# Patient Record
Sex: Female | Born: 1945
Health system: Southern US, Community
[De-identification: ages and names within clinical notes are randomized; demographics above are authoritative.]

## PROBLEM LIST (undated history)

## (undated) DIAGNOSIS — E7439 Other disorders of intestinal carbohydrate absorption: Secondary | ICD-10-CM

## (undated) DIAGNOSIS — I519 Heart disease, unspecified: Secondary | ICD-10-CM

## (undated) DIAGNOSIS — M1990 Unspecified osteoarthritis, unspecified site: Secondary | ICD-10-CM

## (undated) DIAGNOSIS — E039 Hypothyroidism, unspecified: Secondary | ICD-10-CM

## (undated) DIAGNOSIS — F419 Anxiety disorder, unspecified: Secondary | ICD-10-CM

## (undated) DIAGNOSIS — G629 Polyneuropathy, unspecified: Secondary | ICD-10-CM

## (undated) DIAGNOSIS — I2510 Atherosclerotic heart disease of native coronary artery without angina pectoris: Secondary | ICD-10-CM

## (undated) DIAGNOSIS — I219 Acute myocardial infarction, unspecified: Secondary | ICD-10-CM

## (undated) DIAGNOSIS — I10 Essential (primary) hypertension: Secondary | ICD-10-CM

## (undated) HISTORY — DX: Hypothyroidism, unspecified: E03.9

## (undated) HISTORY — DX: Essential (primary) hypertension: I10

## (undated) HISTORY — DX: Polyneuropathy, unspecified: G62.9

## (undated) HISTORY — DX: Atherosclerotic heart disease of native coronary artery without angina pectoris: I25.10

## (undated) HISTORY — DX: Heart disease, unspecified: I51.9

## (undated) HISTORY — DX: Other disorders of intestinal carbohydrate absorption: E74.39

## (undated) HISTORY — DX: Anxiety disorder, unspecified: F41.9

---

## 1977-03-02 HISTORY — PX: TOTAL ABDOMINAL HYSTERECTOMY W/ BILATERAL SALPINGOOPHORECTOMY: SHX83

## 1977-03-02 HISTORY — PX: CHOLECYSTECTOMY: SHX55

## 1984-03-02 HISTORY — PX: CARDIAC SURGERY: SHX584

## 1997-03-02 HISTORY — PX: CORONARY ARTERY BYPASS GRAFT: SHX141

## 1997-06-01 ENCOUNTER — Encounter (HOSPITAL_COMMUNITY): Admission: RE | Admit: 1997-06-01 | Discharge: 1997-08-30 | Payer: Self-pay | Admitting: Cardiology

## 2000-12-28 ENCOUNTER — Encounter (INDEPENDENT_AMBULATORY_CARE_PROVIDER_SITE_OTHER): Payer: Self-pay | Admitting: Specialist

## 2000-12-28 ENCOUNTER — Ambulatory Visit (HOSPITAL_COMMUNITY): Admission: RE | Admit: 2000-12-28 | Discharge: 2000-12-28 | Payer: Self-pay | Admitting: Gastroenterology

## 2008-03-02 HISTORY — PX: OTHER SURGICAL HISTORY: SHX169

## 2009-05-23 ENCOUNTER — Inpatient Hospital Stay (HOSPITAL_COMMUNITY): Admission: EM | Admit: 2009-05-23 | Discharge: 2009-05-25 | Payer: Self-pay | Admitting: Emergency Medicine

## 2009-08-18 ENCOUNTER — Emergency Department (HOSPITAL_BASED_OUTPATIENT_CLINIC_OR_DEPARTMENT_OTHER): Admission: EM | Admit: 2009-08-18 | Discharge: 2009-08-18 | Payer: Self-pay | Admitting: Emergency Medicine

## 2009-08-18 ENCOUNTER — Ambulatory Visit: Payer: Self-pay | Admitting: Radiology

## 2010-05-26 LAB — BASIC METABOLIC PANEL
CO2: 19 mEq/L (ref 19–32)
Calcium: 9 mg/dL (ref 8.4–10.5)
Creatinine, Ser: 0.63 mg/dL (ref 0.4–1.2)
GFR calc Af Amer: >60 mL/min (ref >60)
GFR calc non Af Amer: >60 mL/min (ref >60)
Glucose, Bld: 79 mg/dL (ref 70–99)
Potassium: 4.5 mEq/L (ref 3.5–5.1)

## 2010-05-26 LAB — CBC
HCT: 35.5 % — ABNORMAL LOW (ref 36.0–46.0)
HCT: 38.5 % (ref 36.0–46.0)
HCT: 40.6 % (ref 36.0–46.0)
Hemoglobin: 13 g/dL (ref 12.0–15.0)
Hemoglobin: 13.7 g/dL (ref 12.0–15.0)
MCHC: 34 g/dL (ref 30.0–36.0)
MCV: 91.3 fL (ref 78.0–100.0)
MCV: 91.4 fL (ref 78.0–100.0)
RBC: 4.49 MIL/uL (ref 3.87–5.11)
RDW: 13.9 % (ref 11.5–15.5)
RDW: 14 % (ref 11.5–15.5)
RDW: 14.1 % (ref 11.5–15.5)
WBC: 8.7 10*3/uL (ref 4.0–10.5)

## 2010-05-26 LAB — CK TOTAL AND CKMB (NOT AT ARMC)
Relative Index: INVALID (ref 0.0–2.5)
Total CK: 48 U/L (ref 7–177)

## 2010-05-26 LAB — COMPREHENSIVE METABOLIC PANEL
AST: 23 U/L (ref 0–37)
Alkaline Phosphatase: 58 U/L (ref 39–117)
BUN: 13 mg/dL (ref 6–23)
CO2: 25 mEq/L (ref 19–32)
Calcium: 9.6 mg/dL (ref 8.4–10.5)
Creatinine, Ser: 0.56 mg/dL (ref 0.4–1.2)
Sodium: 138 mEq/L (ref 135–145)
Total Bilirubin: 0.7 mg/dL (ref 0.3–1.2)

## 2010-05-26 LAB — CARDIAC PANEL(CRET KIN+CKTOT+MB+TROPI)
CK, MB: 1.2 ng/mL (ref 0.3–4.0)
Relative Index: INVALID (ref 0.0–2.5)
Troponin I: 0.1 ng/mL — ABNORMAL HIGH (ref 0.00–0.06)
Troponin I: 0.1 ng/mL — ABNORMAL HIGH (ref 0.00–0.06)

## 2010-05-26 LAB — POCT CARDIAC MARKERS
CKMB, poc: <1 ng/mL — ABNORMAL LOW (ref 1.0–8.0)
Myoglobin, poc: 49.8 ng/mL (ref 12–200)
Troponin i, poc: <0.05 ng/mL (ref 0.00–0.09)

## 2010-05-26 LAB — CK: Total CK: 56 U/L (ref 7–177)

## 2010-05-26 LAB — TROPONIN I: Troponin I: 0.09 ng/mL — ABNORMAL HIGH (ref 0.00–0.06)

## 2010-05-26 LAB — DIFFERENTIAL
Monocytes Absolute: 0.8 10*3/uL (ref 0.1–1.0)
Neutrophils Relative %: 53 % (ref 43–77)

## 2010-05-26 LAB — BRAIN NATRIURETIC PEPTIDE: Pro B Natriuretic peptide (BNP): 296 pg/mL — ABNORMAL HIGH (ref 0.0–100.0)

## 2010-05-26 LAB — PROTIME-INR: INR: 0.95 (ref 0.00–1.49)

## 2010-05-26 LAB — TSH: TSH: 3.504 u[IU]/mL (ref 0.350–4.500)

## 2010-05-26 LAB — HEPARIN LEVEL (UNFRACTIONATED): Heparin Unfractionated: 0.24 IU/mL — ABNORMAL LOW (ref 0.30–0.70)

## 2010-05-26 LAB — APTT: aPTT: 35 seconds (ref 24–37)

## 2011-03-19 DIAGNOSIS — D239 Other benign neoplasm of skin, unspecified: Secondary | ICD-10-CM | POA: Diagnosis not present

## 2011-03-19 DIAGNOSIS — L570 Actinic keratosis: Secondary | ICD-10-CM | POA: Diagnosis not present

## 2011-03-19 DIAGNOSIS — L821 Other seborrheic keratosis: Secondary | ICD-10-CM | POA: Diagnosis not present

## 2011-04-14 DIAGNOSIS — I10 Essential (primary) hypertension: Secondary | ICD-10-CM | POA: Diagnosis not present

## 2011-04-14 DIAGNOSIS — G609 Hereditary and idiopathic neuropathy, unspecified: Secondary | ICD-10-CM | POA: Diagnosis not present

## 2011-04-14 DIAGNOSIS — R7301 Impaired fasting glucose: Secondary | ICD-10-CM | POA: Diagnosis not present

## 2011-04-14 DIAGNOSIS — E039 Hypothyroidism, unspecified: Secondary | ICD-10-CM | POA: Diagnosis not present

## 2011-10-07 DIAGNOSIS — E785 Hyperlipidemia, unspecified: Secondary | ICD-10-CM | POA: Diagnosis not present

## 2011-10-07 DIAGNOSIS — I10 Essential (primary) hypertension: Secondary | ICD-10-CM | POA: Diagnosis not present

## 2011-10-07 DIAGNOSIS — I2510 Atherosclerotic heart disease of native coronary artery without angina pectoris: Secondary | ICD-10-CM | POA: Diagnosis not present

## 2011-10-07 DIAGNOSIS — E039 Hypothyroidism, unspecified: Secondary | ICD-10-CM | POA: Diagnosis not present

## 2011-10-07 DIAGNOSIS — R7301 Impaired fasting glucose: Secondary | ICD-10-CM | POA: Diagnosis not present

## 2011-10-14 DIAGNOSIS — I10 Essential (primary) hypertension: Secondary | ICD-10-CM | POA: Diagnosis not present

## 2011-10-14 DIAGNOSIS — I2510 Atherosclerotic heart disease of native coronary artery without angina pectoris: Secondary | ICD-10-CM | POA: Diagnosis not present

## 2011-10-14 DIAGNOSIS — Z0000 Encounter for general adult medical examination without abnormal findings: Secondary | ICD-10-CM | POA: Diagnosis not present

## 2011-10-14 DIAGNOSIS — G609 Hereditary and idiopathic neuropathy, unspecified: Secondary | ICD-10-CM | POA: Diagnosis not present

## 2011-10-15 DIAGNOSIS — Z1212 Encounter for screening for malignant neoplasm of rectum: Secondary | ICD-10-CM | POA: Diagnosis not present

## 2011-11-25 DIAGNOSIS — Z23 Encounter for immunization: Secondary | ICD-10-CM | POA: Diagnosis not present

## 2012-01-14 DIAGNOSIS — Z9861 Coronary angioplasty status: Secondary | ICD-10-CM | POA: Diagnosis not present

## 2012-01-14 DIAGNOSIS — I447 Left bundle-branch block, unspecified: Secondary | ICD-10-CM | POA: Diagnosis not present

## 2012-01-14 DIAGNOSIS — I119 Hypertensive heart disease without heart failure: Secondary | ICD-10-CM | POA: Diagnosis not present

## 2012-01-14 DIAGNOSIS — E669 Obesity, unspecified: Secondary | ICD-10-CM | POA: Diagnosis not present

## 2012-01-14 DIAGNOSIS — I2510 Atherosclerotic heart disease of native coronary artery without angina pectoris: Secondary | ICD-10-CM | POA: Diagnosis not present

## 2012-01-14 DIAGNOSIS — E785 Hyperlipidemia, unspecified: Secondary | ICD-10-CM | POA: Diagnosis not present

## 2012-02-12 DIAGNOSIS — Z1231 Encounter for screening mammogram for malignant neoplasm of breast: Secondary | ICD-10-CM | POA: Diagnosis not present

## 2012-02-16 DIAGNOSIS — I2510 Atherosclerotic heart disease of native coronary artery without angina pectoris: Secondary | ICD-10-CM | POA: Diagnosis not present

## 2012-02-16 DIAGNOSIS — G609 Hereditary and idiopathic neuropathy, unspecified: Secondary | ICD-10-CM | POA: Diagnosis not present

## 2012-02-16 DIAGNOSIS — Z1331 Encounter for screening for depression: Secondary | ICD-10-CM | POA: Diagnosis not present

## 2012-02-16 DIAGNOSIS — R7301 Impaired fasting glucose: Secondary | ICD-10-CM | POA: Diagnosis not present

## 2012-06-28 DIAGNOSIS — E039 Hypothyroidism, unspecified: Secondary | ICD-10-CM | POA: Diagnosis not present

## 2012-06-28 DIAGNOSIS — I10 Essential (primary) hypertension: Secondary | ICD-10-CM | POA: Diagnosis not present

## 2012-06-28 DIAGNOSIS — R7301 Impaired fasting glucose: Secondary | ICD-10-CM | POA: Diagnosis not present

## 2012-06-28 DIAGNOSIS — F411 Generalized anxiety disorder: Secondary | ICD-10-CM | POA: Diagnosis not present

## 2012-06-28 DIAGNOSIS — G609 Hereditary and idiopathic neuropathy, unspecified: Secondary | ICD-10-CM | POA: Diagnosis not present

## 2012-09-08 DIAGNOSIS — H40019 Open angle with borderline findings, low risk, unspecified eye: Secondary | ICD-10-CM | POA: Diagnosis not present

## 2012-09-08 DIAGNOSIS — H40029 Open angle with borderline findings, high risk, unspecified eye: Secondary | ICD-10-CM | POA: Diagnosis not present

## 2012-09-08 DIAGNOSIS — H524 Presbyopia: Secondary | ICD-10-CM | POA: Diagnosis not present

## 2012-09-08 DIAGNOSIS — H04129 Dry eye syndrome of unspecified lacrimal gland: Secondary | ICD-10-CM | POA: Diagnosis not present

## 2012-12-24 DIAGNOSIS — Z23 Encounter for immunization: Secondary | ICD-10-CM | POA: Diagnosis not present

## 2013-01-20 DIAGNOSIS — I10 Essential (primary) hypertension: Secondary | ICD-10-CM | POA: Diagnosis not present

## 2013-01-20 DIAGNOSIS — E785 Hyperlipidemia, unspecified: Secondary | ICD-10-CM | POA: Diagnosis not present

## 2013-01-20 DIAGNOSIS — R7301 Impaired fasting glucose: Secondary | ICD-10-CM | POA: Diagnosis not present

## 2013-01-20 DIAGNOSIS — R809 Proteinuria, unspecified: Secondary | ICD-10-CM | POA: Diagnosis not present

## 2013-01-20 DIAGNOSIS — I2510 Atherosclerotic heart disease of native coronary artery without angina pectoris: Secondary | ICD-10-CM | POA: Diagnosis not present

## 2013-01-20 DIAGNOSIS — E039 Hypothyroidism, unspecified: Secondary | ICD-10-CM | POA: Diagnosis not present

## 2013-02-03 DIAGNOSIS — F411 Generalized anxiety disorder: Secondary | ICD-10-CM | POA: Diagnosis not present

## 2013-02-03 DIAGNOSIS — G609 Hereditary and idiopathic neuropathy, unspecified: Secondary | ICD-10-CM | POA: Diagnosis not present

## 2013-02-03 DIAGNOSIS — N760 Acute vaginitis: Secondary | ICD-10-CM | POA: Diagnosis not present

## 2013-02-03 DIAGNOSIS — R7301 Impaired fasting glucose: Secondary | ICD-10-CM | POA: Diagnosis not present

## 2013-02-03 DIAGNOSIS — I2510 Atherosclerotic heart disease of native coronary artery without angina pectoris: Secondary | ICD-10-CM | POA: Diagnosis not present

## 2013-02-03 DIAGNOSIS — Z0000 Encounter for general adult medical examination without abnormal findings: Secondary | ICD-10-CM | POA: Diagnosis not present

## 2013-02-03 DIAGNOSIS — R82998 Other abnormal findings in urine: Secondary | ICD-10-CM | POA: Diagnosis not present

## 2013-02-03 DIAGNOSIS — E785 Hyperlipidemia, unspecified: Secondary | ICD-10-CM | POA: Diagnosis not present

## 2013-02-07 DIAGNOSIS — Z1212 Encounter for screening for malignant neoplasm of rectum: Secondary | ICD-10-CM | POA: Diagnosis not present

## 2013-02-09 DIAGNOSIS — I119 Hypertensive heart disease without heart failure: Secondary | ICD-10-CM | POA: Diagnosis not present

## 2013-02-09 DIAGNOSIS — E785 Hyperlipidemia, unspecified: Secondary | ICD-10-CM | POA: Diagnosis not present

## 2013-02-09 DIAGNOSIS — I2510 Atherosclerotic heart disease of native coronary artery without angina pectoris: Secondary | ICD-10-CM | POA: Diagnosis not present

## 2013-02-09 DIAGNOSIS — Z9861 Coronary angioplasty status: Secondary | ICD-10-CM | POA: Diagnosis not present

## 2013-02-09 DIAGNOSIS — I447 Left bundle-branch block, unspecified: Secondary | ICD-10-CM | POA: Diagnosis not present

## 2013-02-09 DIAGNOSIS — E669 Obesity, unspecified: Secondary | ICD-10-CM | POA: Diagnosis not present

## 2013-02-16 DIAGNOSIS — Z1231 Encounter for screening mammogram for malignant neoplasm of breast: Secondary | ICD-10-CM | POA: Diagnosis not present

## 2013-05-18 DIAGNOSIS — E785 Hyperlipidemia, unspecified: Secondary | ICD-10-CM | POA: Diagnosis not present

## 2013-05-18 DIAGNOSIS — I2510 Atherosclerotic heart disease of native coronary artery without angina pectoris: Secondary | ICD-10-CM | POA: Diagnosis not present

## 2013-05-18 DIAGNOSIS — I447 Left bundle-branch block, unspecified: Secondary | ICD-10-CM | POA: Diagnosis not present

## 2013-05-18 DIAGNOSIS — Z9861 Coronary angioplasty status: Secondary | ICD-10-CM | POA: Diagnosis not present

## 2013-05-18 DIAGNOSIS — E669 Obesity, unspecified: Secondary | ICD-10-CM | POA: Diagnosis not present

## 2013-05-18 DIAGNOSIS — I119 Hypertensive heart disease without heart failure: Secondary | ICD-10-CM | POA: Diagnosis not present

## 2013-06-14 DIAGNOSIS — G609 Hereditary and idiopathic neuropathy, unspecified: Secondary | ICD-10-CM | POA: Diagnosis not present

## 2013-06-14 DIAGNOSIS — Z6838 Body mass index (BMI) 38.0-38.9, adult: Secondary | ICD-10-CM | POA: Diagnosis not present

## 2013-06-14 DIAGNOSIS — R7301 Impaired fasting glucose: Secondary | ICD-10-CM | POA: Diagnosis not present

## 2013-06-14 DIAGNOSIS — I10 Essential (primary) hypertension: Secondary | ICD-10-CM | POA: Diagnosis not present

## 2013-06-14 DIAGNOSIS — E039 Hypothyroidism, unspecified: Secondary | ICD-10-CM | POA: Diagnosis not present

## 2013-10-19 DIAGNOSIS — G609 Hereditary and idiopathic neuropathy, unspecified: Secondary | ICD-10-CM | POA: Diagnosis not present

## 2013-10-19 DIAGNOSIS — R61 Generalized hyperhidrosis: Secondary | ICD-10-CM | POA: Diagnosis not present

## 2013-10-19 DIAGNOSIS — R7301 Impaired fasting glucose: Secondary | ICD-10-CM | POA: Diagnosis not present

## 2013-10-19 DIAGNOSIS — Z6841 Body Mass Index (BMI) 40.0 and over, adult: Secondary | ICD-10-CM | POA: Diagnosis not present

## 2013-10-19 DIAGNOSIS — E039 Hypothyroidism, unspecified: Secondary | ICD-10-CM | POA: Diagnosis not present

## 2013-10-19 DIAGNOSIS — I10 Essential (primary) hypertension: Secondary | ICD-10-CM | POA: Diagnosis not present

## 2013-10-24 DIAGNOSIS — H18519 Endothelial corneal dystrophy, unspecified eye: Secondary | ICD-10-CM | POA: Diagnosis not present

## 2013-10-24 DIAGNOSIS — H01139 Eczematous dermatitis of unspecified eye, unspecified eyelid: Secondary | ICD-10-CM | POA: Diagnosis not present

## 2013-12-05 DIAGNOSIS — H40013 Open angle with borderline findings, low risk, bilateral: Secondary | ICD-10-CM | POA: Diagnosis not present

## 2013-12-07 DIAGNOSIS — Z955 Presence of coronary angioplasty implant and graft: Secondary | ICD-10-CM | POA: Diagnosis not present

## 2013-12-07 DIAGNOSIS — Z23 Encounter for immunization: Secondary | ICD-10-CM | POA: Diagnosis not present

## 2013-12-07 DIAGNOSIS — E785 Hyperlipidemia, unspecified: Secondary | ICD-10-CM | POA: Diagnosis not present

## 2013-12-07 DIAGNOSIS — E668 Other obesity: Secondary | ICD-10-CM | POA: Diagnosis not present

## 2013-12-07 DIAGNOSIS — I119 Hypertensive heart disease without heart failure: Secondary | ICD-10-CM | POA: Diagnosis not present

## 2013-12-07 DIAGNOSIS — I2510 Atherosclerotic heart disease of native coronary artery without angina pectoris: Secondary | ICD-10-CM | POA: Diagnosis not present

## 2013-12-07 DIAGNOSIS — I447 Left bundle-branch block, unspecified: Secondary | ICD-10-CM | POA: Diagnosis not present

## 2013-12-26 DIAGNOSIS — H2513 Age-related nuclear cataract, bilateral: Secondary | ICD-10-CM | POA: Diagnosis not present

## 2013-12-26 DIAGNOSIS — H40013 Open angle with borderline findings, low risk, bilateral: Secondary | ICD-10-CM | POA: Diagnosis not present

## 2013-12-26 DIAGNOSIS — H524 Presbyopia: Secondary | ICD-10-CM | POA: Diagnosis not present

## 2013-12-26 DIAGNOSIS — H04123 Dry eye syndrome of bilateral lacrimal glands: Secondary | ICD-10-CM | POA: Diagnosis not present

## 2014-01-31 DIAGNOSIS — R8299 Other abnormal findings in urine: Secondary | ICD-10-CM | POA: Diagnosis not present

## 2014-01-31 DIAGNOSIS — R7301 Impaired fasting glucose: Secondary | ICD-10-CM | POA: Diagnosis not present

## 2014-01-31 DIAGNOSIS — E039 Hypothyroidism, unspecified: Secondary | ICD-10-CM | POA: Diagnosis not present

## 2014-01-31 DIAGNOSIS — Z0000 Encounter for general adult medical examination without abnormal findings: Secondary | ICD-10-CM | POA: Diagnosis not present

## 2014-01-31 DIAGNOSIS — E785 Hyperlipidemia, unspecified: Secondary | ICD-10-CM | POA: Diagnosis not present

## 2014-01-31 DIAGNOSIS — I10 Essential (primary) hypertension: Secondary | ICD-10-CM | POA: Diagnosis not present

## 2014-01-31 DIAGNOSIS — I2510 Atherosclerotic heart disease of native coronary artery without angina pectoris: Secondary | ICD-10-CM | POA: Diagnosis not present

## 2014-02-06 DIAGNOSIS — I2510 Atherosclerotic heart disease of native coronary artery without angina pectoris: Secondary | ICD-10-CM | POA: Diagnosis not present

## 2014-02-06 DIAGNOSIS — F419 Anxiety disorder, unspecified: Secondary | ICD-10-CM | POA: Diagnosis not present

## 2014-02-06 DIAGNOSIS — Z008 Encounter for other general examination: Secondary | ICD-10-CM | POA: Diagnosis not present

## 2014-02-06 DIAGNOSIS — Z1389 Encounter for screening for other disorder: Secondary | ICD-10-CM | POA: Diagnosis not present

## 2014-02-06 DIAGNOSIS — R7301 Impaired fasting glucose: Secondary | ICD-10-CM | POA: Diagnosis not present

## 2014-02-06 DIAGNOSIS — I10 Essential (primary) hypertension: Secondary | ICD-10-CM | POA: Diagnosis not present

## 2014-02-06 DIAGNOSIS — E039 Hypothyroidism, unspecified: Secondary | ICD-10-CM | POA: Diagnosis not present

## 2014-02-06 DIAGNOSIS — Z23 Encounter for immunization: Secondary | ICD-10-CM | POA: Diagnosis not present

## 2014-02-06 DIAGNOSIS — G609 Hereditary and idiopathic neuropathy, unspecified: Secondary | ICD-10-CM | POA: Diagnosis not present

## 2014-02-06 DIAGNOSIS — E785 Hyperlipidemia, unspecified: Secondary | ICD-10-CM | POA: Diagnosis not present

## 2014-02-07 DIAGNOSIS — Z1212 Encounter for screening for malignant neoplasm of rectum: Secondary | ICD-10-CM | POA: Diagnosis not present

## 2014-02-21 ENCOUNTER — Encounter: Payer: Self-pay | Admitting: Neurology

## 2014-03-08 DIAGNOSIS — Z1231 Encounter for screening mammogram for malignant neoplasm of breast: Secondary | ICD-10-CM | POA: Diagnosis not present

## 2014-03-11 ENCOUNTER — Telehealth: Payer: Self-pay | Admitting: Neurology

## 2014-03-11 NOTE — Telephone Encounter (Signed)
Patient was previously seen by Dr. Erling Cruz. Please pull old notes.

## 2014-03-12 ENCOUNTER — Encounter: Payer: Self-pay | Admitting: Neurology

## 2014-03-12 ENCOUNTER — Ambulatory Visit (INDEPENDENT_AMBULATORY_CARE_PROVIDER_SITE_OTHER): Payer: Medicare Other | Admitting: Neurology

## 2014-03-12 VITALS — BP 124/71 | HR 61 | Temp 98.0°F | Ht 63.0 in | Wt 231.0 lb

## 2014-03-12 DIAGNOSIS — M545 Low back pain, unspecified: Secondary | ICD-10-CM

## 2014-03-12 DIAGNOSIS — G609 Hereditary and idiopathic neuropathy, unspecified: Secondary | ICD-10-CM

## 2014-03-12 NOTE — Patient Instructions (Addendum)
You have a condition called peripheral neuropathy, i. e. nerve damage. There is no specific treatment for most neuropathies. The most common cause for neuropathy is diabetes in this country, in which case, tight glucose control is key. Other causes include thyroid disease, and some vitamin deficiencies. Certain medications such as chemotherapy agents and other chemicals or toxins including alcohol can cause neuropathy. There are some genetic conditions or hereditary neuropathies. Typically patients will report a family history of neuropathy in those conditions. There are cases associated with cancers and autoimmune conditions. Most neuropathies are progressive unless a root cause can be found and treated. For most neuropathies there is no actual cure or reversing of symptoms. Painful neuropathy can be difficult to treat symptomatically, but there are some medications available to ease the symptoms. Electrophysiologic testing with nerve conduction velocity studies and EMG (muscle testing) do not always pick up neuropathies that affect the smallest fibers. Other common tests include different type of blood work, and rarely, spinal fluid testing, and sometimes we resort to asking for a nerve and muscle biopsy.  I think, using gabapentin is the best symptomatic treatment. I can see you back as needed at this time.

## 2014-03-12 NOTE — Progress Notes (Signed)
Subjective:    Patient ID: Kristin Allen is a 69 y.o. female.  HPI     Star Age, MD, PhD [facility] [address] [place],  [address]  Dear Dr. Dagmar Hait,   I saw your patient, Kristin Allen, upon your kind request in my neurologic clinic today for initial consultation of her peripheral neuropathy. The patient is unaccompanied today. As you know, Kristin Allen is a 69 year old right-handed woman with an underlying medical history of coronary artery disease, status post CABG and single stent placement, hypertension, hypothyroidism, hyperlipidemia, anxiety disorder, glucose intolerance and obesity, who has had a long-standing history of peripheral neuropathy for which she was followed by Dr. Erling Cruz in the past. She reports progression of foot pain. She has thankfully not fallen. She tries to exercise at least twice a week in the form of walking. She has had workup for neuropathy in the past, including a EMG/NCV test, she says. Symptomatic treatment includes gabapentin, total of 1800 mg daily. She tried Lyrica, which she does not recall if it helped, and is on as needed hydrocodone for back pain and has has not tolerated Cymbalta in the past, due to profuse sweating. She has also tried topical agents, which did not help. She saw a dermatologist as well.   She has looked into a supplement, called Nerve Renew, which contains B vitamins and some other supplements that I am not familiar with. She has not started it yet.  She has occasional fingertips. She has not noted any muscle wasting or actual weakness. She does not require walking aid. She has a walking cane available from her deceased mother-in-law.  Her Past Medical History Is Significant For: Past Medical History  Diagnosis Date  . CAD (coronary artery disease)     positive test in 2011 with one stent placed in Dr Wynonia Lawman and colleagues  . HTN (hypertension)   . Hypothyroidism   . Glucose  intolerance (malabsorption)   . Peripheral neuropathy     followed by Dr Erling Cruz every 5 years  . Anxiety   . Heart disease     Her Past Surgical History Is Significant For: Past Surgical History  Procedure Laterality Date  . Coronary artery bypass graft  1999  . Cholecystectomy  1979  . Total abdominal hysterectomy w/ bilateral salpingoophorectomy  1979  . Stint  2010  . Cardiac surgery  1986    by pass    Her Family History Is Significant For: Family History  Problem Relation Age of Onset  . Heart disease Father   . CAD Father   . CVA Father   . Hypertension Father   . Diabetes Mellitus I Sister   . Anxiety disorder Sister   . Aneurysm Sister   . Heart attack Father     Her Social History Is Significant For: History   Social History  . Marital Status: Married    Spouse Name: Juanda Crumble    Number of Children: 3  . Years of Education: 12   Occupational History  .      retired 2009   Social History Main Topics  . Smoking status: Former Research scientist (life sciences)  . Smokeless tobacco: Never Used  . Alcohol Use: 0.0 oz/week    0 Not specified per week     Comment: wine  . Drug Use: No  . Sexual Activity: None   Other Topics Concern  . None   Social History Narrative   Consumes 2 cups of caffeine  daily    Her Allergies Are:  Allergies  Allergen Reactions  . Demerol [Meperidine]     hallucinations  :   Her Current Medications Are:  Outpatient Encounter Prescriptions as of 03/12/2014  Medication Sig  . Acetaminophen (TYLENOL EXTRA STRENGTH PO) Take by mouth. 500-25mg , take one tablet qhs prn  . ALPRAZolam (XANAX) 0.5 MG tablet Take 0.5 mg by mouth 3 (three) times daily.  Marland Kitchen aspirin EC 81 MG tablet Take 81 mg by mouth daily.  Marland Kitchen atorvastatin (LIPITOR) 40 MG tablet Take 40 mg by mouth daily.  . Calcium Carbonate-Vitamin D (CALTRATE 600+D PO) Take by mouth 2 (two) times daily.  . clopidogrel (PLAVIX) 75 MG tablet Take 75 mg by mouth daily.  Marland Kitchen FLUZONE HIGH-DOSE 0.5 ML SUSY   .  furosemide (LASIX) 40 MG tablet Take 40 mg by mouth. Take 2 tablets every am, 1 tablet every pm  . gabapentin (NEURONTIN) 300 MG capsule Take 300 mg by mouth 5 (five) times daily.  . hydrALAZINE (APRESOLINE) 50 MG tablet Take 50 mg by mouth 2 (two) times daily.  Marland Kitchen HYDROcodone-acetaminophen (NORCO/VICODIN) 5-325 MG per tablet Take 1 tablet by mouth every 6 (six) hours as needed for moderate pain. 1-2 every 6 hours as needed  . levothyroxine (SYNTHROID) 50 MCG tablet Take 50 mcg by mouth daily.  . metoprolol (LOPRESSOR) 50 MG tablet Take 50 mg by mouth 2 (two) times daily. Take 1/2 tablet twice daily  . niacin (NIASPAN) 500 MG CR tablet Take 500 mg by mouth at bedtime.  Marland Kitchen olmesartan (BENICAR) 40 MG tablet Take 40 mg by mouth daily.  :   Review of Systems:  Out of a complete 14 point review of systems, all are reviewed and negative with the exception of these symptoms as listed below:   Review of Systems  Neurological: Positive for numbness.    Objective:  Neurologic Exam  Physical Exam Physical Examination:   Filed Vitals:   03/12/14 0934  BP: 124/71  Pulse: 61  Temp: 98 F (36.7 C)    General Examination: The patient is a very pleasant 69 y.o. female in no acute distress. She appears well-developed and well-nourished and very well groomed.   HEENT: Normocephalic, atraumatic, pupils are equal, round and reactive to light and accommodation. Funduscopic exam is normal with sharp disc margins noted. Extraocular tracking is good without limitation to gaze excursion or nystagmus noted. Normal smooth pursuit is noted. Hearing is grossly intact. Tympanic membranes are clear bilaterally. Face is symmetric with normal facial animation and normal facial sensation. Speech is clear with no dysarthria noted. There is no hypophonia. There is no lip, neck/head, jaw or voice tremor. Neck is supple with full range of passive and active motion. There are no carotid bruits on auscultation. Oropharynx  exam reveals: moderate mouth dryness, good dental hygiene and mild airway crowding, due to redundant soft palate. Mallampati is class II. Tongue protrudes centrally and palate elevates symmetrically.   Chest: Clear to auscultation without wheezing, rhonchi or crackles noted.  Heart: S1+S2+0, regular and normal without murmurs, rubs or gallops noted.   Abdomen: Soft, non-tender and non-distended with normal bowel sounds appreciated on auscultation.  Extremities: There is trace pitting edema in the distal lower extremities bilaterally. Pedal pulses are intact.  Skin: Warm and dry without trophic changes noted. There are no varicose veins.  Musculoskeletal: exam reveals no obvious joint deformities, tenderness or joint swelling or erythema.   Neurologically:  Mental status: The patient is awake, alert  and oriented in all 4 spheres. Her immediate and remote memory, attention, language skills and fund of knowledge are appropriate. There is no evidence of aphasia, agnosia, apraxia or anomia. Speech is clear with normal prosody and enunciation. Thought process is linear. Mood is normal and affect is normal.  Cranial nerves II - XII are as described above under HEENT exam. In addition: shoulder shrug is normal with equal shoulder height noted. Motor exam: Normal bulk, strength and tone is noted. There is no drift, tremor or rebound. Romberg is negative. Reflexes are 2+ throughout. Babinski: Toes are flexor bilaterally. Fine motor skills and coordination: intact with normal finger taps, normal hand movements, normal rapid alternating patting, normal foot taps and normal foot agility.  Cerebellar testing: No dysmetria or intention tremor on finger to nose testing. Heel to shin is unremarkable bilaterally. There is no truncal or gait ataxia.  Sensory exam: intact to light touch, pinprick, vibration, temperature sense in the upper extremities with decrease in sensation to all modalities in the distal LEs up  to the midshin areas.  Gait, station and balance: She stands easily. No veering to one side is noted. No leaning to one side is noted. Posture is age-appropriate and stance is narrow based. Gait shows normal stride length and normal pace. No problems turning are noted. Tandem walk is unremarkable. Intact toe and heel stance is noted.               Assessment and Plan:    In summary, Kristin Allen is a very pleasant 69 y.o.-year old female coronary artery disease, status post CABG and single stent placement, hypertension, hypothyroidism, hyperlipidemia, anxiety disorder, glucose intolerance and obesity, who has had a long-standing history of peripheral neuropathy . Her exam is in keeping with decreased sensation to all modalities in the distal lower extremities up to the mid shin areas. She has preserved reflexes throughout. Her balance and walking are not impaired including her tandem walk. Overall, her physical exam is otherwise reassuring. She has had workup for neuropathy in the past. She is not keen on repeating EMG nerve conduction testing which I offered to her. Symptomatic treatment has been tried with several medications including topical agents with limited success her some side effects with other medications, and she is currently on 1800 mg of gabapentin total dose. She takes it at night and 2 doses. It makes her sleepy and she avoids it during the day. For lower back pain she has been using hydrocodone as needed. At this juncture, I explained to her that symptomatic treatment for neuropathy is geared towards controlling discomfort and pain. She has had reasonable success with the current regimen. I explained to her that this time there is probably not much else we can do since she has had workup for neuropathy including electrophysiological testing which she does not desire to repeat at this time. She is encouraged to follow-up with you as previously scheduled and I can see her back as needed. I  answered all her questions today and she was in agreement.  Thank you very much for allowing me to participate in the care of this nice patient. If I can be of any further assistance to you please do not hesitate to call me at [phone].  Sincerely,   Star Age, MD, PhD

## 2014-03-12 NOTE — Telephone Encounter (Signed)
No notes in [facility], will check with Hilda Blades to see if she can find anything

## 2014-06-12 DIAGNOSIS — I2510 Atherosclerotic heart disease of native coronary artery without angina pectoris: Secondary | ICD-10-CM | POA: Diagnosis not present

## 2014-06-12 DIAGNOSIS — Z955 Presence of coronary angioplasty implant and graft: Secondary | ICD-10-CM | POA: Diagnosis not present

## 2014-06-12 DIAGNOSIS — I447 Left bundle-branch block, unspecified: Secondary | ICD-10-CM | POA: Diagnosis not present

## 2014-06-12 DIAGNOSIS — I119 Hypertensive heart disease without heart failure: Secondary | ICD-10-CM | POA: Diagnosis not present

## 2014-06-12 DIAGNOSIS — E668 Other obesity: Secondary | ICD-10-CM | POA: Diagnosis not present

## 2014-06-12 DIAGNOSIS — E785 Hyperlipidemia, unspecified: Secondary | ICD-10-CM | POA: Diagnosis not present

## 2014-08-09 DIAGNOSIS — I10 Essential (primary) hypertension: Secondary | ICD-10-CM | POA: Diagnosis not present

## 2014-08-09 DIAGNOSIS — E785 Hyperlipidemia, unspecified: Secondary | ICD-10-CM | POA: Diagnosis not present

## 2014-08-09 DIAGNOSIS — I2510 Atherosclerotic heart disease of native coronary artery without angina pectoris: Secondary | ICD-10-CM | POA: Diagnosis not present

## 2014-08-09 DIAGNOSIS — R7301 Impaired fasting glucose: Secondary | ICD-10-CM | POA: Diagnosis not present

## 2014-08-09 DIAGNOSIS — G609 Hereditary and idiopathic neuropathy, unspecified: Secondary | ICD-10-CM | POA: Diagnosis not present

## 2014-09-19 DIAGNOSIS — I708 Atherosclerosis of other arteries: Secondary | ICD-10-CM | POA: Diagnosis not present

## 2014-12-03 DIAGNOSIS — Z23 Encounter for immunization: Secondary | ICD-10-CM | POA: Diagnosis not present

## 2015-02-01 DIAGNOSIS — R8299 Other abnormal findings in urine: Secondary | ICD-10-CM | POA: Diagnosis not present

## 2015-02-01 DIAGNOSIS — I2510 Atherosclerotic heart disease of native coronary artery without angina pectoris: Secondary | ICD-10-CM | POA: Diagnosis not present

## 2015-02-01 DIAGNOSIS — N390 Urinary tract infection, site not specified: Secondary | ICD-10-CM | POA: Diagnosis not present

## 2015-02-01 DIAGNOSIS — R7301 Impaired fasting glucose: Secondary | ICD-10-CM | POA: Diagnosis not present

## 2015-02-01 DIAGNOSIS — E784 Other hyperlipidemia: Secondary | ICD-10-CM | POA: Diagnosis not present

## 2015-02-01 DIAGNOSIS — I10 Essential (primary) hypertension: Secondary | ICD-10-CM | POA: Diagnosis not present

## 2015-02-01 DIAGNOSIS — E038 Other specified hypothyroidism: Secondary | ICD-10-CM | POA: Diagnosis not present

## 2015-02-08 DIAGNOSIS — Z0000 Encounter for general adult medical examination without abnormal findings: Secondary | ICD-10-CM | POA: Diagnosis not present

## 2015-02-08 DIAGNOSIS — Z1389 Encounter for screening for other disorder: Secondary | ICD-10-CM | POA: Diagnosis not present

## 2015-02-08 DIAGNOSIS — E784 Other hyperlipidemia: Secondary | ICD-10-CM | POA: Diagnosis not present

## 2015-02-08 DIAGNOSIS — R7301 Impaired fasting glucose: Secondary | ICD-10-CM | POA: Diagnosis not present

## 2015-02-08 DIAGNOSIS — G609 Hereditary and idiopathic neuropathy, unspecified: Secondary | ICD-10-CM | POA: Diagnosis not present

## 2015-02-08 DIAGNOSIS — I10 Essential (primary) hypertension: Secondary | ICD-10-CM | POA: Diagnosis not present

## 2015-02-08 DIAGNOSIS — E038 Other specified hypothyroidism: Secondary | ICD-10-CM | POA: Diagnosis not present

## 2015-02-08 DIAGNOSIS — I2510 Atherosclerotic heart disease of native coronary artery without angina pectoris: Secondary | ICD-10-CM | POA: Diagnosis not present

## 2015-02-08 DIAGNOSIS — J069 Acute upper respiratory infection, unspecified: Secondary | ICD-10-CM | POA: Diagnosis not present

## 2015-02-08 DIAGNOSIS — Z6838 Body mass index (BMI) 38.0-38.9, adult: Secondary | ICD-10-CM | POA: Diagnosis not present

## 2015-02-08 DIAGNOSIS — I700 Atherosclerosis of aorta: Secondary | ICD-10-CM | POA: Diagnosis not present

## 2015-02-11 DIAGNOSIS — Z1212 Encounter for screening for malignant neoplasm of rectum: Secondary | ICD-10-CM | POA: Diagnosis not present

## 2015-03-12 DIAGNOSIS — Z1231 Encounter for screening mammogram for malignant neoplasm of breast: Secondary | ICD-10-CM | POA: Diagnosis not present

## 2015-03-19 DIAGNOSIS — C4441 Basal cell carcinoma of skin of scalp and neck: Secondary | ICD-10-CM | POA: Diagnosis not present

## 2015-03-19 DIAGNOSIS — L565 Disseminated superficial actinic porokeratosis (DSAP): Secondary | ICD-10-CM | POA: Diagnosis not present

## 2015-03-19 DIAGNOSIS — C4442 Squamous cell carcinoma of skin of scalp and neck: Secondary | ICD-10-CM | POA: Diagnosis not present

## 2015-03-29 DIAGNOSIS — Z85828 Personal history of other malignant neoplasm of skin: Secondary | ICD-10-CM | POA: Diagnosis not present

## 2015-03-29 DIAGNOSIS — C4441 Basal cell carcinoma of skin of scalp and neck: Secondary | ICD-10-CM | POA: Diagnosis not present

## 2015-06-27 DIAGNOSIS — I447 Left bundle-branch block, unspecified: Secondary | ICD-10-CM | POA: Diagnosis not present

## 2015-06-27 DIAGNOSIS — E668 Other obesity: Secondary | ICD-10-CM | POA: Diagnosis not present

## 2015-06-27 DIAGNOSIS — E785 Hyperlipidemia, unspecified: Secondary | ICD-10-CM | POA: Diagnosis not present

## 2015-06-27 DIAGNOSIS — I2510 Atherosclerotic heart disease of native coronary artery without angina pectoris: Secondary | ICD-10-CM | POA: Diagnosis not present

## 2015-06-27 DIAGNOSIS — E039 Hypothyroidism, unspecified: Secondary | ICD-10-CM | POA: Diagnosis not present

## 2015-06-27 DIAGNOSIS — I119 Hypertensive heart disease without heart failure: Secondary | ICD-10-CM | POA: Diagnosis not present

## 2015-06-27 DIAGNOSIS — Z955 Presence of coronary angioplasty implant and graft: Secondary | ICD-10-CM | POA: Diagnosis not present

## 2015-07-03 DIAGNOSIS — Z5181 Encounter for therapeutic drug level monitoring: Secondary | ICD-10-CM | POA: Diagnosis not present

## 2015-07-04 DIAGNOSIS — D122 Benign neoplasm of ascending colon: Secondary | ICD-10-CM | POA: Diagnosis not present

## 2015-07-04 DIAGNOSIS — K635 Polyp of colon: Secondary | ICD-10-CM | POA: Diagnosis not present

## 2015-07-04 DIAGNOSIS — Z86010 Personal history of colonic polyps: Secondary | ICD-10-CM | POA: Diagnosis not present

## 2015-07-04 DIAGNOSIS — D124 Benign neoplasm of descending colon: Secondary | ICD-10-CM | POA: Diagnosis not present

## 2015-07-04 DIAGNOSIS — K5730 Diverticulosis of large intestine without perforation or abscess without bleeding: Secondary | ICD-10-CM | POA: Diagnosis not present

## 2015-08-06 DIAGNOSIS — Z1389 Encounter for screening for other disorder: Secondary | ICD-10-CM | POA: Diagnosis not present

## 2015-08-06 DIAGNOSIS — R7301 Impaired fasting glucose: Secondary | ICD-10-CM | POA: Diagnosis not present

## 2015-08-06 DIAGNOSIS — I2510 Atherosclerotic heart disease of native coronary artery without angina pectoris: Secondary | ICD-10-CM | POA: Diagnosis not present

## 2015-08-06 DIAGNOSIS — E784 Other hyperlipidemia: Secondary | ICD-10-CM | POA: Diagnosis not present

## 2015-08-06 DIAGNOSIS — Z6839 Body mass index (BMI) 39.0-39.9, adult: Secondary | ICD-10-CM | POA: Diagnosis not present

## 2015-08-06 DIAGNOSIS — I10 Essential (primary) hypertension: Secondary | ICD-10-CM | POA: Diagnosis not present

## 2015-08-06 DIAGNOSIS — G609 Hereditary and idiopathic neuropathy, unspecified: Secondary | ICD-10-CM | POA: Diagnosis not present

## 2015-08-06 DIAGNOSIS — D126 Benign neoplasm of colon, unspecified: Secondary | ICD-10-CM | POA: Diagnosis not present

## 2015-09-27 DIAGNOSIS — L570 Actinic keratosis: Secondary | ICD-10-CM | POA: Diagnosis not present

## 2015-09-27 DIAGNOSIS — L565 Disseminated superficial actinic porokeratosis (DSAP): Secondary | ICD-10-CM | POA: Diagnosis not present

## 2015-09-27 DIAGNOSIS — L814 Other melanin hyperpigmentation: Secondary | ICD-10-CM | POA: Diagnosis not present

## 2015-09-27 DIAGNOSIS — Z85828 Personal history of other malignant neoplasm of skin: Secondary | ICD-10-CM | POA: Diagnosis not present

## 2015-09-27 DIAGNOSIS — L821 Other seborrheic keratosis: Secondary | ICD-10-CM | POA: Diagnosis not present

## 2015-11-21 DIAGNOSIS — M5136 Other intervertebral disc degeneration, lumbar region: Secondary | ICD-10-CM | POA: Diagnosis not present

## 2015-11-21 DIAGNOSIS — Z6838 Body mass index (BMI) 38.0-38.9, adult: Secondary | ICD-10-CM | POA: Diagnosis not present

## 2015-11-21 DIAGNOSIS — Z23 Encounter for immunization: Secondary | ICD-10-CM | POA: Diagnosis not present

## 2015-12-04 DIAGNOSIS — M5441 Lumbago with sciatica, right side: Secondary | ICD-10-CM | POA: Diagnosis not present

## 2015-12-25 DIAGNOSIS — G8929 Other chronic pain: Secondary | ICD-10-CM | POA: Diagnosis not present

## 2015-12-25 DIAGNOSIS — M5441 Lumbago with sciatica, right side: Secondary | ICD-10-CM | POA: Diagnosis not present

## 2016-01-22 DIAGNOSIS — M5441 Lumbago with sciatica, right side: Secondary | ICD-10-CM | POA: Diagnosis not present

## 2016-01-22 DIAGNOSIS — G8929 Other chronic pain: Secondary | ICD-10-CM | POA: Diagnosis not present

## 2016-02-05 DIAGNOSIS — E038 Other specified hypothyroidism: Secondary | ICD-10-CM | POA: Diagnosis not present

## 2016-02-05 DIAGNOSIS — G8929 Other chronic pain: Secondary | ICD-10-CM | POA: Diagnosis not present

## 2016-02-05 DIAGNOSIS — E784 Other hyperlipidemia: Secondary | ICD-10-CM | POA: Diagnosis not present

## 2016-02-05 DIAGNOSIS — R7301 Impaired fasting glucose: Secondary | ICD-10-CM | POA: Diagnosis not present

## 2016-02-05 DIAGNOSIS — M5441 Lumbago with sciatica, right side: Secondary | ICD-10-CM | POA: Diagnosis not present

## 2016-02-05 DIAGNOSIS — I10 Essential (primary) hypertension: Secondary | ICD-10-CM | POA: Diagnosis not present

## 2016-02-12 DIAGNOSIS — I10 Essential (primary) hypertension: Secondary | ICD-10-CM | POA: Diagnosis not present

## 2016-02-12 DIAGNOSIS — F419 Anxiety disorder, unspecified: Secondary | ICD-10-CM | POA: Diagnosis not present

## 2016-02-12 DIAGNOSIS — E784 Other hyperlipidemia: Secondary | ICD-10-CM | POA: Diagnosis not present

## 2016-02-12 DIAGNOSIS — Z0000 Encounter for general adult medical examination without abnormal findings: Secondary | ICD-10-CM | POA: Diagnosis not present

## 2016-02-12 DIAGNOSIS — I2510 Atherosclerotic heart disease of native coronary artery without angina pectoris: Secondary | ICD-10-CM | POA: Diagnosis not present

## 2016-02-12 DIAGNOSIS — E038 Other specified hypothyroidism: Secondary | ICD-10-CM | POA: Diagnosis not present

## 2016-02-12 DIAGNOSIS — I700 Atherosclerosis of aorta: Secondary | ICD-10-CM | POA: Diagnosis not present

## 2016-02-12 DIAGNOSIS — R7301 Impaired fasting glucose: Secondary | ICD-10-CM | POA: Diagnosis not present

## 2016-02-12 DIAGNOSIS — M5136 Other intervertebral disc degeneration, lumbar region: Secondary | ICD-10-CM | POA: Diagnosis not present

## 2016-02-12 DIAGNOSIS — D126 Benign neoplasm of colon, unspecified: Secondary | ICD-10-CM | POA: Diagnosis not present

## 2016-02-12 DIAGNOSIS — G609 Hereditary and idiopathic neuropathy, unspecified: Secondary | ICD-10-CM | POA: Diagnosis not present

## 2016-02-14 ENCOUNTER — Other Ambulatory Visit: Payer: Self-pay | Admitting: Internal Medicine

## 2016-02-14 DIAGNOSIS — I700 Atherosclerosis of aorta: Secondary | ICD-10-CM

## 2016-02-19 DIAGNOSIS — G8929 Other chronic pain: Secondary | ICD-10-CM | POA: Diagnosis not present

## 2016-02-19 DIAGNOSIS — M792 Neuralgia and neuritis, unspecified: Secondary | ICD-10-CM | POA: Diagnosis not present

## 2016-02-19 DIAGNOSIS — M5441 Lumbago with sciatica, right side: Secondary | ICD-10-CM | POA: Diagnosis not present

## 2016-02-20 ENCOUNTER — Ambulatory Visit
Admission: RE | Admit: 2016-02-20 | Discharge: 2016-02-20 | Disposition: A | Discharge status: Home / Self Care | Payer: Medicare Other | Source: Ambulatory Visit | Attending: Internal Medicine | Admitting: Internal Medicine

## 2016-02-20 DIAGNOSIS — Z87891 Personal history of nicotine dependence: Secondary | ICD-10-CM | POA: Diagnosis not present

## 2016-02-20 DIAGNOSIS — I700 Atherosclerosis of aorta: Secondary | ICD-10-CM

## 2016-03-03 DIAGNOSIS — H43811 Vitreous degeneration, right eye: Secondary | ICD-10-CM | POA: Diagnosis not present

## 2016-03-03 DIAGNOSIS — H04123 Dry eye syndrome of bilateral lacrimal glands: Secondary | ICD-10-CM | POA: Diagnosis not present

## 2016-03-03 DIAGNOSIS — H2513 Age-related nuclear cataract, bilateral: Secondary | ICD-10-CM | POA: Diagnosis not present

## 2016-03-03 DIAGNOSIS — H40013 Open angle with borderline findings, low risk, bilateral: Secondary | ICD-10-CM | POA: Diagnosis not present

## 2016-05-01 DIAGNOSIS — Z1231 Encounter for screening mammogram for malignant neoplasm of breast: Secondary | ICD-10-CM | POA: Diagnosis not present

## 2016-06-25 DIAGNOSIS — Z955 Presence of coronary angioplasty implant and graft: Secondary | ICD-10-CM | POA: Diagnosis not present

## 2016-06-25 DIAGNOSIS — E785 Hyperlipidemia, unspecified: Secondary | ICD-10-CM | POA: Diagnosis not present

## 2016-06-25 DIAGNOSIS — I2510 Atherosclerotic heart disease of native coronary artery without angina pectoris: Secondary | ICD-10-CM | POA: Diagnosis not present

## 2016-06-25 DIAGNOSIS — E668 Other obesity: Secondary | ICD-10-CM | POA: Diagnosis not present

## 2016-06-25 DIAGNOSIS — I119 Hypertensive heart disease without heart failure: Secondary | ICD-10-CM | POA: Diagnosis not present

## 2016-06-25 DIAGNOSIS — I447 Left bundle-branch block, unspecified: Secondary | ICD-10-CM | POA: Diagnosis not present

## 2016-06-25 DIAGNOSIS — E039 Hypothyroidism, unspecified: Secondary | ICD-10-CM | POA: Diagnosis not present

## 2016-08-10 DIAGNOSIS — I700 Atherosclerosis of aorta: Secondary | ICD-10-CM | POA: Diagnosis not present

## 2016-08-10 DIAGNOSIS — Z6838 Body mass index (BMI) 38.0-38.9, adult: Secondary | ICD-10-CM | POA: Diagnosis not present

## 2016-08-10 DIAGNOSIS — E784 Other hyperlipidemia: Secondary | ICD-10-CM | POA: Diagnosis not present

## 2016-08-10 DIAGNOSIS — G609 Hereditary and idiopathic neuropathy, unspecified: Secondary | ICD-10-CM | POA: Diagnosis not present

## 2016-08-10 DIAGNOSIS — R7301 Impaired fasting glucose: Secondary | ICD-10-CM | POA: Diagnosis not present

## 2016-08-10 DIAGNOSIS — E038 Other specified hypothyroidism: Secondary | ICD-10-CM | POA: Diagnosis not present

## 2016-08-10 DIAGNOSIS — I2510 Atherosclerotic heart disease of native coronary artery without angina pectoris: Secondary | ICD-10-CM | POA: Diagnosis not present

## 2016-08-10 DIAGNOSIS — I10 Essential (primary) hypertension: Secondary | ICD-10-CM | POA: Diagnosis not present

## 2016-08-10 DIAGNOSIS — Z1389 Encounter for screening for other disorder: Secondary | ICD-10-CM | POA: Diagnosis not present

## 2016-09-30 DIAGNOSIS — L570 Actinic keratosis: Secondary | ICD-10-CM | POA: Diagnosis not present

## 2016-09-30 DIAGNOSIS — L814 Other melanin hyperpigmentation: Secondary | ICD-10-CM | POA: Diagnosis not present

## 2016-09-30 DIAGNOSIS — Z85828 Personal history of other malignant neoplasm of skin: Secondary | ICD-10-CM | POA: Diagnosis not present

## 2016-09-30 DIAGNOSIS — L821 Other seborrheic keratosis: Secondary | ICD-10-CM | POA: Diagnosis not present

## 2016-09-30 DIAGNOSIS — D2271 Melanocytic nevi of right lower limb, including hip: Secondary | ICD-10-CM | POA: Diagnosis not present

## 2016-09-30 DIAGNOSIS — L565 Disseminated superficial actinic porokeratosis (DSAP): Secondary | ICD-10-CM | POA: Diagnosis not present

## 2016-12-21 DIAGNOSIS — Z23 Encounter for immunization: Secondary | ICD-10-CM | POA: Diagnosis not present

## 2017-03-03 DIAGNOSIS — E7849 Other hyperlipidemia: Secondary | ICD-10-CM | POA: Diagnosis not present

## 2017-03-03 DIAGNOSIS — E038 Other specified hypothyroidism: Secondary | ICD-10-CM | POA: Diagnosis not present

## 2017-03-03 DIAGNOSIS — I10 Essential (primary) hypertension: Secondary | ICD-10-CM | POA: Diagnosis not present

## 2017-03-03 DIAGNOSIS — R82998 Other abnormal findings in urine: Secondary | ICD-10-CM | POA: Diagnosis not present

## 2017-03-03 DIAGNOSIS — R7301 Impaired fasting glucose: Secondary | ICD-10-CM | POA: Diagnosis not present

## 2017-03-04 DIAGNOSIS — H40013 Open angle with borderline findings, low risk, bilateral: Secondary | ICD-10-CM | POA: Diagnosis not present

## 2017-03-04 DIAGNOSIS — H04123 Dry eye syndrome of bilateral lacrimal glands: Secondary | ICD-10-CM | POA: Diagnosis not present

## 2017-03-04 DIAGNOSIS — H25013 Cortical age-related cataract, bilateral: Secondary | ICD-10-CM | POA: Diagnosis not present

## 2017-03-04 DIAGNOSIS — H2513 Age-related nuclear cataract, bilateral: Secondary | ICD-10-CM | POA: Diagnosis not present

## 2017-03-10 DIAGNOSIS — Z6837 Body mass index (BMI) 37.0-37.9, adult: Secondary | ICD-10-CM | POA: Diagnosis not present

## 2017-03-10 DIAGNOSIS — I700 Atherosclerosis of aorta: Secondary | ICD-10-CM | POA: Diagnosis not present

## 2017-03-10 DIAGNOSIS — R7301 Impaired fasting glucose: Secondary | ICD-10-CM | POA: Diagnosis not present

## 2017-03-10 DIAGNOSIS — G608 Other hereditary and idiopathic neuropathies: Secondary | ICD-10-CM | POA: Diagnosis not present

## 2017-03-10 DIAGNOSIS — F418 Other specified anxiety disorders: Secondary | ICD-10-CM | POA: Diagnosis not present

## 2017-03-10 DIAGNOSIS — I2510 Atherosclerotic heart disease of native coronary artery without angina pectoris: Secondary | ICD-10-CM | POA: Diagnosis not present

## 2017-03-10 DIAGNOSIS — Z1389 Encounter for screening for other disorder: Secondary | ICD-10-CM | POA: Diagnosis not present

## 2017-03-10 DIAGNOSIS — M5136 Other intervertebral disc degeneration, lumbar region: Secondary | ICD-10-CM | POA: Diagnosis not present

## 2017-03-10 DIAGNOSIS — E038 Other specified hypothyroidism: Secondary | ICD-10-CM | POA: Diagnosis not present

## 2017-03-10 DIAGNOSIS — E7849 Other hyperlipidemia: Secondary | ICD-10-CM | POA: Diagnosis not present

## 2017-03-10 DIAGNOSIS — Z0000 Encounter for general adult medical examination without abnormal findings: Secondary | ICD-10-CM | POA: Diagnosis not present

## 2017-03-10 DIAGNOSIS — I10 Essential (primary) hypertension: Secondary | ICD-10-CM | POA: Diagnosis not present

## 2017-03-11 DIAGNOSIS — Z1212 Encounter for screening for malignant neoplasm of rectum: Secondary | ICD-10-CM | POA: Diagnosis not present

## 2017-05-05 DIAGNOSIS — Z1231 Encounter for screening mammogram for malignant neoplasm of breast: Secondary | ICD-10-CM | POA: Diagnosis not present

## 2017-07-01 DIAGNOSIS — E785 Hyperlipidemia, unspecified: Secondary | ICD-10-CM | POA: Diagnosis not present

## 2017-07-01 DIAGNOSIS — I2510 Atherosclerotic heart disease of native coronary artery without angina pectoris: Secondary | ICD-10-CM | POA: Diagnosis not present

## 2017-07-01 DIAGNOSIS — I447 Left bundle-branch block, unspecified: Secondary | ICD-10-CM | POA: Diagnosis not present

## 2017-07-01 DIAGNOSIS — I119 Hypertensive heart disease without heart failure: Secondary | ICD-10-CM | POA: Diagnosis not present

## 2017-07-01 DIAGNOSIS — E668 Other obesity: Secondary | ICD-10-CM | POA: Diagnosis not present

## 2017-07-01 DIAGNOSIS — E039 Hypothyroidism, unspecified: Secondary | ICD-10-CM | POA: Diagnosis not present

## 2017-07-01 DIAGNOSIS — Z955 Presence of coronary angioplasty implant and graft: Secondary | ICD-10-CM | POA: Diagnosis not present

## 2017-07-19 ENCOUNTER — Other Ambulatory Visit: Payer: Self-pay | Admitting: Internal Medicine

## 2017-07-19 DIAGNOSIS — I700 Atherosclerosis of aorta: Secondary | ICD-10-CM

## 2017-07-29 DIAGNOSIS — G609 Hereditary and idiopathic neuropathy, unspecified: Secondary | ICD-10-CM | POA: Diagnosis not present

## 2017-07-29 DIAGNOSIS — I2510 Atherosclerotic heart disease of native coronary artery without angina pectoris: Secondary | ICD-10-CM | POA: Diagnosis not present

## 2017-07-29 DIAGNOSIS — I10 Essential (primary) hypertension: Secondary | ICD-10-CM | POA: Diagnosis not present

## 2017-07-29 DIAGNOSIS — R7301 Impaired fasting glucose: Secondary | ICD-10-CM | POA: Diagnosis not present

## 2017-07-29 DIAGNOSIS — Z6838 Body mass index (BMI) 38.0-38.9, adult: Secondary | ICD-10-CM | POA: Diagnosis not present

## 2017-08-03 ENCOUNTER — Ambulatory Visit
Admission: RE | Admit: 2017-08-03 | Discharge: 2017-08-03 | Disposition: A | Discharge status: Home / Self Care | Payer: Medicare Other | Source: Ambulatory Visit | Attending: Internal Medicine | Admitting: Internal Medicine

## 2017-08-03 DIAGNOSIS — I700 Atherosclerosis of aorta: Secondary | ICD-10-CM

## 2017-08-03 DIAGNOSIS — I77811 Abdominal aortic ectasia: Secondary | ICD-10-CM | POA: Diagnosis not present

## 2017-11-17 DIAGNOSIS — Z23 Encounter for immunization: Secondary | ICD-10-CM | POA: Diagnosis not present

## 2018-03-08 DIAGNOSIS — H04123 Dry eye syndrome of bilateral lacrimal glands: Secondary | ICD-10-CM | POA: Diagnosis not present

## 2018-03-08 DIAGNOSIS — H2513 Age-related nuclear cataract, bilateral: Secondary | ICD-10-CM | POA: Diagnosis not present

## 2018-03-08 DIAGNOSIS — H40013 Open angle with borderline findings, low risk, bilateral: Secondary | ICD-10-CM | POA: Diagnosis not present

## 2018-03-08 DIAGNOSIS — H25013 Cortical age-related cataract, bilateral: Secondary | ICD-10-CM | POA: Diagnosis not present

## 2018-03-24 DIAGNOSIS — I10 Essential (primary) hypertension: Secondary | ICD-10-CM | POA: Diagnosis not present

## 2018-03-24 DIAGNOSIS — E7849 Other hyperlipidemia: Secondary | ICD-10-CM | POA: Diagnosis not present

## 2018-03-24 DIAGNOSIS — E038 Other specified hypothyroidism: Secondary | ICD-10-CM | POA: Diagnosis not present

## 2018-03-24 DIAGNOSIS — R82998 Other abnormal findings in urine: Secondary | ICD-10-CM | POA: Diagnosis not present

## 2018-03-24 DIAGNOSIS — R7301 Impaired fasting glucose: Secondary | ICD-10-CM | POA: Diagnosis not present

## 2018-03-31 DIAGNOSIS — Z1331 Encounter for screening for depression: Secondary | ICD-10-CM | POA: Diagnosis not present

## 2018-04-27 DIAGNOSIS — C44319 Basal cell carcinoma of skin of other parts of face: Secondary | ICD-10-CM | POA: Diagnosis not present

## 2018-04-27 DIAGNOSIS — L570 Actinic keratosis: Secondary | ICD-10-CM | POA: Diagnosis not present

## 2018-04-27 DIAGNOSIS — L821 Other seborrheic keratosis: Secondary | ICD-10-CM | POA: Diagnosis not present

## 2018-04-27 DIAGNOSIS — D225 Melanocytic nevi of trunk: Secondary | ICD-10-CM | POA: Diagnosis not present

## 2018-04-27 DIAGNOSIS — L814 Other melanin hyperpigmentation: Secondary | ICD-10-CM | POA: Diagnosis not present

## 2018-04-27 DIAGNOSIS — L565 Disseminated superficial actinic porokeratosis (DSAP): Secondary | ICD-10-CM | POA: Diagnosis not present

## 2018-04-27 DIAGNOSIS — D485 Neoplasm of uncertain behavior of skin: Secondary | ICD-10-CM | POA: Diagnosis not present

## 2018-04-27 DIAGNOSIS — Z85828 Personal history of other malignant neoplasm of skin: Secondary | ICD-10-CM | POA: Diagnosis not present

## 2018-05-11 DIAGNOSIS — Z1231 Encounter for screening mammogram for malignant neoplasm of breast: Secondary | ICD-10-CM | POA: Diagnosis not present

## 2018-05-25 DIAGNOSIS — C44319 Basal cell carcinoma of skin of other parts of face: Secondary | ICD-10-CM | POA: Diagnosis not present

## 2018-05-25 DIAGNOSIS — Z85828 Personal history of other malignant neoplasm of skin: Secondary | ICD-10-CM | POA: Diagnosis not present

## 2018-07-05 ENCOUNTER — Telehealth: Payer: Self-pay

## 2018-07-05 NOTE — Telephone Encounter (Signed)
Contacted pt to change upcoming appointment to virtual visit. Pt declined and state she would prefer to be seen in office. Appointment rescheduled for 8/18 at 3 pm with Dr. Harrell Gave.

## 2018-07-08 ENCOUNTER — Ambulatory Visit: Payer: Medicare Other | Admitting: Cardiology

## 2018-07-16 ENCOUNTER — Emergency Department (HOSPITAL_BASED_OUTPATIENT_CLINIC_OR_DEPARTMENT_OTHER)
Admission: EM | Admit: 2018-07-16 | Discharge: 2018-07-16 | Disposition: A | Discharge status: Home / Self Care | Payer: Medicare Other | Attending: Emergency Medicine | Admitting: Emergency Medicine

## 2018-07-16 ENCOUNTER — Emergency Department (HOSPITAL_BASED_OUTPATIENT_CLINIC_OR_DEPARTMENT_OTHER): Payer: Medicare Other

## 2018-07-16 ENCOUNTER — Other Ambulatory Visit: Payer: Self-pay

## 2018-07-16 ENCOUNTER — Encounter (HOSPITAL_BASED_OUTPATIENT_CLINIC_OR_DEPARTMENT_OTHER): Payer: Self-pay

## 2018-07-16 DIAGNOSIS — L03116 Cellulitis of left lower limb: Secondary | ICD-10-CM | POA: Insufficient documentation

## 2018-07-16 DIAGNOSIS — Z87891 Personal history of nicotine dependence: Secondary | ICD-10-CM | POA: Diagnosis not present

## 2018-07-16 DIAGNOSIS — Z7902 Long term (current) use of antithrombotics/antiplatelets: Secondary | ICD-10-CM | POA: Insufficient documentation

## 2018-07-16 DIAGNOSIS — E039 Hypothyroidism, unspecified: Secondary | ICD-10-CM | POA: Insufficient documentation

## 2018-07-16 DIAGNOSIS — I2510 Atherosclerotic heart disease of native coronary artery without angina pectoris: Secondary | ICD-10-CM | POA: Diagnosis not present

## 2018-07-16 DIAGNOSIS — Z7982 Long term (current) use of aspirin: Secondary | ICD-10-CM | POA: Insufficient documentation

## 2018-07-16 DIAGNOSIS — Z951 Presence of aortocoronary bypass graft: Secondary | ICD-10-CM | POA: Diagnosis not present

## 2018-07-16 DIAGNOSIS — M19072 Primary osteoarthritis, left ankle and foot: Secondary | ICD-10-CM | POA: Diagnosis not present

## 2018-07-16 DIAGNOSIS — I10 Essential (primary) hypertension: Secondary | ICD-10-CM | POA: Diagnosis not present

## 2018-07-16 DIAGNOSIS — Z79899 Other long term (current) drug therapy: Secondary | ICD-10-CM | POA: Diagnosis not present

## 2018-07-16 DIAGNOSIS — L0390 Cellulitis, unspecified: Secondary | ICD-10-CM

## 2018-07-16 DIAGNOSIS — M79672 Pain in left foot: Secondary | ICD-10-CM | POA: Diagnosis present

## 2018-07-16 MED ORDER — CLINDAMYCIN HCL 300 MG PO CAPS: 300.0000 mg | ORAL_CAPSULE | Freq: Three times a day (TID) | ORAL | 0 refills | Status: AC

## 2018-07-16 NOTE — ED Notes (Signed)
Pt c/o acute  right foot and right 2nd toe pain and swelling that starrted around 8pm last night. Pt denies injury

## 2018-07-16 NOTE — ED Notes (Signed)
Pt ambulatory to room.

## 2018-07-16 NOTE — Discharge Instructions (Signed)
Follow-up with your primary care doctor early next week.  Return to the ED if your symptoms worsen.

## 2018-07-16 NOTE — ED Notes (Signed)
Patient transported to X-ray 

## 2018-07-16 NOTE — ED Notes (Signed)
ED Provider at bedside. 

## 2018-07-16 NOTE — ED Provider Notes (Signed)
[facility] EMERGENCY DEPARTMENT Provider Note   CSN: [id number] Arrival date & time: 07/16/18  1311    History   Chief Complaint Chief Complaint  Patient presents with  . Foot Pain    HPI Kristin Allen is a 73 y.o. female.     The history is provided by the patient.  Foot Pain  This is a new problem. The current episode started yesterday. The problem occurs daily. The problem has not changed since onset.Pertinent negatives include no chest pain, no abdominal pain, no headaches and no shortness of breath. Nothing aggravates the symptoms. Nothing relieves the symptoms. She has tried nothing for the symptoms. The treatment provided no relief.    Past Medical History:  Diagnosis Date  . Anxiety   . CAD (coronary artery disease)    positive test in 2011 with one stent placed in Dr Wynonia Lawman and colleagues  . Glucose intolerance (malabsorption)   . Heart disease   . HTN (hypertension)   . Hypothyroidism   . Peripheral neuropathy    followed by Dr Erling Cruz every 5 years    There are no active problems to display for this patient.   Past Surgical History:  Procedure Laterality Date  . [place]   by pass  . CHOLECYSTECTOMY  1979  . CORONARY ARTERY BYPASS GRAFT  1999  . stint  2010  . TOTAL ABDOMINAL HYSTERECTOMY W/ BILATERAL SALPINGOOPHORECTOMY  1979     OB History   No obstetric history on file.      Home Medications    Prior to Admission medications   Medication Sig Start Date End Date Taking? Authorizing Provider  Acetaminophen (TYLENOL EXTRA STRENGTH PO) Take by mouth. 500-25mg , take one tablet qhs prn    [provider]  ALPRAZolam (XANAX) 0.5 MG tablet Take 0.5 mg by mouth 3 (three) times daily.    [provider]  aspirin EC 81 MG tablet Take 81 mg by mouth daily.    [provider]  atorvastatin (LIPITOR) 40 MG tablet Take 40 mg by mouth daily.    [provider]  Calcium Carbonate-Vitamin D  (CALTRATE 600+D PO) Take by mouth 2 (two) times daily.    [provider]  clindamycin (CLEOCIN) 300 MG capsule Take 1 capsule (300 mg total) by mouth 3 (three) times daily for 10 days. 07/16/18 07/26/18  Neziah Vogelgesang, DO  clopidogrel (PLAVIX) 75 MG tablet Take 75 mg by mouth daily.    [provider]  FLUZONE HIGH-DOSE 0.5 ML SUSY  12/07/13   [provider]  furosemide (LASIX) 40 MG tablet Take 40 mg by mouth. Take 2 tablets every am, 1 tablet every pm    [provider]  gabapentin (NEURONTIN) 300 MG capsule Take 300 mg by mouth 5 (five) times daily.    [provider]  hydrALAZINE (APRESOLINE) 50 MG tablet Take 50 mg by mouth 2 (two) times daily.    [provider]  HYDROcodone-acetaminophen (NORCO/VICODIN) 5-325 MG per tablet Take 1 tablet by mouth every 6 (six) hours as needed for moderate pain. 1-2 every 6 hours as needed    [provider]  levothyroxine (SYNTHROID) 50 MCG tablet Take 50 mcg by mouth daily.    [provider]  metoprolol (LOPRESSOR) 50 MG tablet Take 50 mg by mouth 2 (two) times daily. Take 1/2 tablet twice daily    [provider]  niacin (NIASPAN) 500 MG CR tablet Take 500 mg by mouth  at bedtime.    [provider]  olmesartan (BENICAR) 40 MG tablet Take 40 mg by mouth daily.    [provider]    Family History Family History  Problem Relation Age of Onset  . Heart disease Father   . CAD Father   . CVA Father   . Hypertension Father   . Heart attack Father   . Diabetes Mellitus I Sister   . Anxiety disorder Sister   . Aneurysm Sister     Social History Social History   Tobacco Use  . Smoking status: Former Research scientist (life sciences)  . Smokeless tobacco: Never Used  Substance Use Topics  . Alcohol use: Yes    Alcohol/week: 0.0 standard drinks    Comment: wine  . Drug use: No     Allergies   Demerol [meperidine]   Review of Systems Review of Systems  Respiratory:  Negative for shortness of breath.   Cardiovascular: Negative for chest pain.  Gastrointestinal: Negative for abdominal pain.  Musculoskeletal: Negative for arthralgias, back pain, gait problem, joint swelling, myalgias, neck pain and neck stiffness.  Skin: Positive for color change. Negative for pallor, rash and wound.  Neurological: Negative for dizziness, tremors, seizures, syncope, facial asymmetry, speech difficulty, weakness, light-headedness, numbness and headaches.     Physical Exam Updated Vital Signs BP (!) 170/79 (BP Location: Right Arm)   Pulse 62   Resp 17   Ht 5\' 3"  (1.6 m)   Wt 91.2 kg   SpO2 99%   BMI 35.61 kg/m   Physical Exam Vitals signs and nursing note reviewed.  Constitutional:      General: She is not in acute distress.    Appearance: She is well-developed.  HENT:     Head: Normocephalic and atraumatic.  Eyes:     Conjunctiva/sclera: Conjunctivae normal.  Neck:     Musculoskeletal: Neck supple.  Cardiovascular:     Rate and Rhythm: Normal rate and regular rhythm.     Pulses: Normal pulses.     Heart sounds: Normal heart sounds. No murmur.  Pulmonary:     Effort: Pulmonary effort is normal. No respiratory distress.     Breath sounds: Normal breath sounds.  Abdominal:     Palpations: Abdomen is soft.     Tenderness: There is no abdominal tenderness.  Musculoskeletal:     Right lower leg: No edema.     Left lower leg: No edema.  Skin:    General: Skin is warm and dry.     Capillary Refill: Capillary refill takes less than 2 seconds.     Coloration: Skin is not pale.     Findings: Bruising (to left second toe with some warmth) present. No erythema.  Neurological:     General: No focal deficit present.     Mental Status: She is alert and oriented to person, place, and time.     Sensory: Sensory deficit (decreased sensation in bilateral feet (chronic)) present.     Motor: No weakness.     Gait: Gait normal.      ED Treatments / Results  Labs  (all labs ordered are listed, but only abnormal results are displayed) Labs Reviewed - No data to display  EKG None  Radiology Dg Foot Complete Left  Result Date: 07/16/2018 CLINICAL DATA:  73 year old with peripheral neuropathy presenting with pain, bruising and swelling involving the LEFT foot localizing to the base of the second through fourth toes. No known injuries. EXAM: LEFT FOOT - COMPLETE 3+ VIEW  COMPARISON:  None. FINDINGS: Osseous demineralization. No evidence of acute or subacute fracture. Mild narrowing of the IP joint spaces of the second through fourth toes. Remaining joint spaces well-preserved. No erosions. Cortical thickening involving the MEDIAL cortex of the second and third metatarsals. Small plantar calcaneal spur. Mild DORSAL soft tissue swelling overlying the metatarsal heads. IMPRESSION: 1. No acute or subacute osseous abnormality. 2. Osseous demineralization. 3. Mild osteoarthritis involving the IP joints of the second through fourth toes. 4. Cortical thickening involving the second and third metatarsals which may indicate a chronic stress reaction. Electronically Signed   By: Evangeline Dakin M.D.   On: 07/16/2018 14:09    Procedures Procedures (including critical care time)  Medications Ordered in ED Medications - No data to display   Initial Impression / Assessment and Plan / ED Course  I have reviewed the triage vital signs and the nursing notes.  Pertinent labs & imaging results that were available during my care of the patient were reviewed by me and considered in my medical decision making (see chart for details).        CALEYAH JR is a 73 year old female with history of CAD, hypertension, peripheral neuropathy in her feet who presents to the ED with bruising to the left second toe.  Patient with normal vitals.  No fever.  No signs of major leg swelling on exam.  Patient noticed that her left second toe was purple starting yesterday.  She has some  neuropathy and does not always feel if she has injured her feet.  She does try to take a good look at her feet every several days.  And noticed that this bruising occurred yesterday.  Does not appear to be gout.  Patient has no history of gout.  She has good peripheral pulses.  No concern for arterial ischemia.  Lower concern for cellulitis given that it is mostly purple and looks more bruising from a trauma.  X-ray was performed that showed no acute findings.  Overall we will treat conservatively with antibiotics as could be an underlying cellulitis, but likely bone bruise.  Recommend close follow-up with primary care doctor for wound recheck.  Given return precautions and discharged from ED in good condition.  No concern for DVT as there is no swelling in the legs.  This chart was dictated using voice recognition software.  Despite best efforts to proofread,  errors can occur which can change the documentation meaning.    Final Clinical Impressions(s) / ED Diagnoses   Final diagnoses:  Cellulitis, unspecified cellulitis site    ED Discharge Orders         Ordered    clindamycin (CLEOCIN) 300 MG capsule  3 times daily     07/16/18 1419           Jahmeek Shirk, Quita Skye, DO 07/17/18 1059

## 2018-07-16 NOTE — ED Triage Notes (Signed)
Pt presents with bruising to toes/top of left foot. Pt has hx of neuropathy and DVT. Pt able to bare weight.

## 2018-07-20 ENCOUNTER — Telehealth (HOSPITAL_COMMUNITY): Payer: Self-pay | Admitting: Rehabilitation

## 2018-07-20 ENCOUNTER — Ambulatory Visit (HOSPITAL_COMMUNITY)
Admission: RE | Admit: 2018-07-20 | Discharge: 2018-07-20 | Disposition: A | Discharge status: Home / Self Care | Payer: Medicare Other | Source: Ambulatory Visit | Attending: Family | Admitting: Family

## 2018-07-20 ENCOUNTER — Other Ambulatory Visit: Payer: Self-pay

## 2018-07-20 ENCOUNTER — Other Ambulatory Visit (HOSPITAL_COMMUNITY): Payer: Self-pay | Admitting: Internal Medicine

## 2018-07-20 DIAGNOSIS — M79672 Pain in left foot: Secondary | ICD-10-CM

## 2018-07-20 NOTE — Telephone Encounter (Signed)
The above patient or their representative was contacted and gave the following answers to these questions:         Do you have any of the following symptoms? No  Fever                    Cough                   Shortness of breath  Do  you have any of the following other symptoms? No   muscle pain         vomiting,        diarrhea        rash         weakness        red eye        abdominal pain         bruising          bruising or bleeding              joint pain           severe headache    Have you been in contact with someone who was or has been sick in the past 2 weeks? No  Yes                 Unsure                         Unable to assess   Does the person that you were in contact with have any of the following symptoms?   Cough         shortness of breath           muscle pain         vomiting,            diarrhea            rash            weakness           fever            red eye           abdominal pain           bruising  or  bleeding                joint pain                severe headache               Have you  or someone you have been in contact with traveled internationally in th last month? No        If yes, which countries?   Have you  or someone you have been in contact with traveled outside [place] in th last month? No         If yes, which state and city?   COMMENTS OR ACTION PLAN FOR THIS PATIENT:          

## 2018-08-16 ENCOUNTER — Other Ambulatory Visit: Payer: Self-pay | Admitting: Cardiology

## 2018-08-16 MED ORDER — NIACIN ER (ANTIHYPERLIPIDEMIC) 500 MG PO TBCR: 500.0000 mg | EXTENDED_RELEASE_TABLET | Freq: Every day | ORAL | 0 refills | Status: DC

## 2018-08-16 NOTE — Telephone Encounter (Signed)
New Message    *STAT* If patient is at the pharmacy, call can be transferred to refill team.   1. Which medications need to be refilled? (please list name of each medication and dose if known) niacin (NIASPAN) 500 MG CR tablet   2. Which pharmacy/location (including street and city if local pharmacy) is medication to be sent to? [place], [place] - [address] AT [place]  3. Do they need a 30 day or 90 day supply? 90   Patient formerly saw Dr. Wynonia Lawman

## 2018-09-06 DIAGNOSIS — M545 Low back pain: Secondary | ICD-10-CM | POA: Diagnosis not present

## 2018-09-06 DIAGNOSIS — M5416 Radiculopathy, lumbar region: Secondary | ICD-10-CM | POA: Diagnosis not present

## 2018-09-22 DIAGNOSIS — M5136 Other intervertebral disc degeneration, lumbar region: Secondary | ICD-10-CM | POA: Diagnosis not present

## 2018-10-05 DIAGNOSIS — G609 Hereditary and idiopathic neuropathy, unspecified: Secondary | ICD-10-CM | POA: Diagnosis not present

## 2018-10-05 DIAGNOSIS — R7301 Impaired fasting glucose: Secondary | ICD-10-CM | POA: Diagnosis not present

## 2018-10-05 DIAGNOSIS — E785 Hyperlipidemia, unspecified: Secondary | ICD-10-CM | POA: Diagnosis not present

## 2018-10-05 DIAGNOSIS — F419 Anxiety disorder, unspecified: Secondary | ICD-10-CM | POA: Diagnosis not present

## 2018-10-05 DIAGNOSIS — I2510 Atherosclerotic heart disease of native coronary artery without angina pectoris: Secondary | ICD-10-CM | POA: Diagnosis not present

## 2018-10-05 DIAGNOSIS — Z79899 Other long term (current) drug therapy: Secondary | ICD-10-CM | POA: Diagnosis not present

## 2018-10-05 DIAGNOSIS — I119 Hypertensive heart disease without heart failure: Secondary | ICD-10-CM | POA: Diagnosis not present

## 2018-10-05 DIAGNOSIS — M5136 Other intervertebral disc degeneration, lumbar region: Secondary | ICD-10-CM | POA: Diagnosis not present

## 2018-10-06 DIAGNOSIS — M545 Low back pain: Secondary | ICD-10-CM | POA: Diagnosis not present

## 2018-10-06 DIAGNOSIS — M5136 Other intervertebral disc degeneration, lumbar region: Secondary | ICD-10-CM | POA: Diagnosis not present

## 2018-10-18 ENCOUNTER — Encounter: Payer: Self-pay | Admitting: Cardiology

## 2018-10-18 ENCOUNTER — Other Ambulatory Visit: Payer: Self-pay

## 2018-10-18 ENCOUNTER — Ambulatory Visit (INDEPENDENT_AMBULATORY_CARE_PROVIDER_SITE_OTHER): Payer: Medicare Other | Admitting: Cardiology

## 2018-10-18 VITALS — BP 163/81 | HR 53 | Temp 97.3°F | Ht 62.0 in | Wt 189.2 lb

## 2018-10-18 DIAGNOSIS — I2510 Atherosclerotic heart disease of native coronary artery without angina pectoris: Secondary | ICD-10-CM | POA: Diagnosis not present

## 2018-10-18 DIAGNOSIS — E782 Mixed hyperlipidemia: Secondary | ICD-10-CM | POA: Diagnosis not present

## 2018-10-18 DIAGNOSIS — E66811 Obesity, class 1: Secondary | ICD-10-CM

## 2018-10-18 DIAGNOSIS — I10 Essential (primary) hypertension: Secondary | ICD-10-CM

## 2018-10-18 DIAGNOSIS — Z7182 Exercise counseling: Secondary | ICD-10-CM

## 2018-10-18 DIAGNOSIS — I447 Left bundle-branch block, unspecified: Secondary | ICD-10-CM | POA: Diagnosis not present

## 2018-10-18 DIAGNOSIS — Z6834 Body mass index (BMI) 34.0-34.9, adult: Secondary | ICD-10-CM | POA: Diagnosis not present

## 2018-10-18 DIAGNOSIS — E6609 Other obesity due to excess calories: Secondary | ICD-10-CM | POA: Diagnosis not present

## 2018-10-18 DIAGNOSIS — Z713 Dietary counseling and surveillance: Secondary | ICD-10-CM

## 2018-10-18 NOTE — Progress Notes (Signed)
Cardiology Office Note:    Date:  10/18/2018   ID:  Kristin Allen, DOB Jul 07, 1945, MRN [id number]  PCP:  Kristin Solian, MD  Cardiologist:  Buford Dresser, MD PhD (prior Dr. Wynonia Lawman)  Referring MD: Kristin Solian, MD   CC: establish care with me/general cardiology follow up  History of Present Illness:    Kristin Allen is a 73 y.o. female with a hx of . She was previously followed by Dr. Wynonia Lawman. Has a history of CAD, first MI at age 1 (in 62) and then CABG in 1998. Had cath with Dr. Wynonia Lawman in 2011, had PCI to SVG at that time. Her anginal equivalent is left arm pain. Only her father had history of MI.  Her main issues have been neuropathy and back pain. Limits her activity level, especially now with Covid. Feeling down and isolated.   Doesn't check BP routinely but does have a cuff. Instructed on how to monitor at home. Her BP is elevated today but she thinks it had been well controlled at PCP office.   Disussed niacin, vascepa.   Denies chest pain, shortness of breath at rest or with normal exertion. No PND, orthopnea, LE edema or unexpected weight gain. No syncope or palpitations.  Past Medical History:  Diagnosis Date  . Anxiety   . CAD (coronary artery disease)    positive test in 2011 with one stent placed in Dr Wynonia Lawman and colleagues  . Glucose intolerance (malabsorption)   . Heart disease   . HTN (hypertension)   . Hypothyroidism   . Peripheral neuropathy    followed by Dr Erling Cruz every 5 years    Past Surgical History:  Procedure Laterality Date  . [place]   by pass  . CHOLECYSTECTOMY  1979  . CORONARY ARTERY BYPASS GRAFT  1999  . stint  2010  . TOTAL ABDOMINAL HYSTERECTOMY W/ BILATERAL SALPINGOOPHORECTOMY  1979    Current Medications: Current Outpatient Medications on File Prior to Visit  Medication Sig  . Acetaminophen (TYLENOL EXTRA STRENGTH PO) Take by mouth. 500-25mg , take one tablet qhs prn  . ALPRAZolam (XANAX) 0.5 MG  tablet Take 0.5 mg by mouth 3 (three) times daily.  Marland Kitchen aspirin EC 81 MG tablet Take 81 mg by mouth daily.  Marland Kitchen atorvastatin (LIPITOR) 40 MG tablet Take 40 mg by mouth daily.  . Calcium Carbonate-Vitamin D (CALTRATE 600+D PO) Take by mouth 2 (two) times daily.  . clopidogrel (PLAVIX) 75 MG tablet Take 75 mg by mouth daily.  Marland Kitchen FLUZONE HIGH-DOSE 0.5 ML SUSY   . furosemide (LASIX) 40 MG tablet Take 40 mg by mouth. Take 2 tablets every am, 1 tablet every pm  . gabapentin (NEURONTIN) 300 MG capsule Take 300 mg by mouth 5 (five) times daily.  . hydrALAZINE (APRESOLINE) 50 MG tablet Take 50 mg by mouth 2 (two) times daily.  Marland Kitchen HYDROcodone-acetaminophen (NORCO/VICODIN) 5-325 MG per tablet Take 1 tablet by mouth every 6 (six) hours as needed for moderate pain. 1-2 every 6 hours as needed  . levothyroxine (SYNTHROID) 50 MCG tablet Take 50 mcg by mouth daily.  . metoprolol (LOPRESSOR) 50 MG tablet Take 50 mg by mouth 2 (two) times daily. Take 1/2 tablet twice daily  . niacin (NIASPAN) 500 MG CR tablet Take 1 tablet (500 mg total) by mouth at bedtime. KEEP OV.  . olmesartan (BENICAR) 40 MG tablet Take 40 mg by mouth daily.   No current facility-administered medications on file prior to visit.  Allergies:   Demerol [meperidine]   Social History   Socioeconomic History  . Marital status: Married    Spouse name: Juanda Crumble  . Number of children: 3  . Years of education: 10  . Highest education level: Not on file  Occupational History    Comment: retired 2009  Social Needs  . Financial resource strain: Not on file  . Food insecurity    Worry: Not on file    Inability: Not on file  . Transportation needs    Medical: Not on file    Non-medical: Not on file  Tobacco Use  . Smoking status: Former Research scientist (life sciences)  . Smokeless tobacco: Never Used  Substance and Sexual Activity  . Alcohol use: Yes    Alcohol/week: 0.0 standard drinks    Comment: wine  . Drug use: No  . Sexual activity: Not on file   Lifestyle  . Physical activity    Days per week: Not on file    Minutes per session: Not on file  . Stress: Not on file  Relationships  . Social Herbalist on phone: Not on file    Gets together: Not on file    Attends religious service: Not on file    Active member of club or organization: Not on file    Attends meetings of clubs or organizations: Not on file    Relationship status: Not on file  Other Topics Concern  . Not on file  Social History Narrative   Consumes 2 cups of caffeine daily     Family History: The patient's family history includes Aneurysm in her sister; Anxiety disorder in her sister; CAD in her father; CVA in her father; Diabetes Mellitus I in her sister; Heart attack in her father; Heart disease in her father; Hypertension in her father.  ROS:   Please see the history of present illness.  Additional pertinent ROS: Constitutional: Negative for chills, fever, night sweats, unintentional weight loss  HENT: Negative for ear pain and hearing loss.   Eyes: Negative for loss of vision and eye pain.  Respiratory: Negative for cough, sputum, wheezing.   Cardiovascular: See HPI. Gastrointestinal: Negative for abdominal pain, melena, and hematochezia.  Genitourinary: Negative for dysuria and hematuria.  Musculoskeletal: Negative for falls and myalgias.  Skin: Negative for itching and rash.  Neurological: Negative for focal weakness, focal sensory changes and loss of consciousness.  Endo/Heme/Allergies: Does not bruise/bleed easily.     EKGs/Labs/Other Studies Reviewed:    The following studies were reviewed today: From Dr. Thurman Coyer notes:  Cath 05/24/2009 LV gram with EF 45%.  Natives: LM: 60-70% ostial LCx: occluded LAD: occluded after diagonal RCA: proximally occluded Grafts: SVG-RCA-PDA: widely patent SVG-OM: 99% proximal graft stenosis, PCI to this lesion LIMA-LAD-Diag: widely patent  Lexiscan 02/28/2009 Mild breast attenuation  Moderately severe fixed defect in anteroseptal region consistent with prior MI No ischemia  Echo 02/20/2009 LV EF 40-45% Mild cLVH Hypokinesis of anterior, apex, lateral, septal walls No hemodynamically significant valve disease (Mild MR, trace TR)  EKG:  EKG is personally reviewed.  The ekg ordered today demonstrates sinus bradycardia with PVC, LBBB.  Recent Labs: No results found for requested labs within last 8760 hours.  Recent Lipid Panel No results found for: CHOL, TRIG, HDL, CHOLHDL, VLDL, LDLCALC, LDLDIRECT  Physical Exam:    VS:  BP (!) 163/81   Pulse (!) 53   Temp (!) 97.3 F (36.3 C)   Ht 5\' 2"  (1.575 m)   Wt  189 lb 3.2 oz (85.8 kg)   SpO2 95%   BMI 34.61 kg/m     Wt Readings from Last 3 Encounters:  10/18/18 189 lb 3.2 oz (85.8 kg)  07/16/18 201 lb (91.2 kg)  03/12/14 231 lb (104.8 kg)     GEN: Well nourished, well developed in no acute distress HEENT: Normal, moist mucous membranes NECK: No JVD CARDIAC: regular rhythm, normal S1 and S2, no murmurs, rubs, gallops.  VASCULAR: Radial and DP pulses 2+ bilaterally. No carotid bruits RESPIRATORY:  Clear to auscultation without rales, wheezing or rhonchi  ABDOMEN: Soft, non-tender, non-distended MUSCULOSKELETAL:  Ambulates independently SKIN: Warm and dry, no edema NEUROLOGIC:  Alert and oriented x 3. No focal neuro deficits noted. PSYCHIATRIC:  Normal affect    ASSESSMENT:    1. Coronary artery disease involving native heart without angina pectoris, unspecified vessel or lesion type   2. Mixed hyperlipidemia   3. Essential hypertension   4. Nutritional counseling   5. Exercise counseling   6. Class 1 obesity due to excess calories with serious comorbidity and body mass index (BMI) of 34.0 to 34.9 in adult   7. LBBB (left bundle branch block)    PLAN:    CAD s/p CABG: no angina, though activity limited -on aspirin and clopidogrel. Could consider dropping clopidogrel, though she is tolerating and has  been on for many years. -on high intensity statin -on beta blocker and ARB  Hyperlipidemia: discussed current recommendations re Niacin (not recommended). She will stop this. When she gets her yearly lipids at her PCP, she will send the results to me. Based on this, we will discuss whether she meets criteria for vascepa -LDL goal <70, at goal with LDL 51 03/2018 -continue atorvastatin 40 mg.  Hypertension, with reported hypertensive heart disease per notes: elevated today, though she thinks it had been well controlled prior. She is hesitant to adjust medications as she has been doing well on these medications for a long time. Her last BP at Dr. Thurman Coyer office was 134/70. -instructed on how to monitor with home BP cuff -goal <130/80, she will call if her BP is consistently greater than this so that we can adjust medications -continue furosemide 80 mg in AM and 40 mg in PM -continue hydralazine 50 mg BID. Consider changing this to amlodipine -continue metoprolol 25 mg BID. Consider changing this to carvedilol for better BP control -continue olmesartan 40 mg daily -given CAD, consider spironolactone in the future  LBBB: not new  Secondary prevention recommendations: -recommend heart healthy/Mediterranean diet, with whole grains, fruits, vegetable, fish, lean meats, nuts, and olive oil. Limit salt. -recommend moderate walking, 3-5 times/week for 30-50 minutes each session. Aim for at least 150 minutes.week. Goal should be pace of 3 miles/hours, or walking 1.5 miles in 30 minutes -recommend avoidance of tobacco products. Avoid excess alcohol. -Additional risk factor control:  -Diabetes: No formal diagnosis, A1c is 5.7 03/2018  -Lipids: from [facility] 03/2018: Tchol 119, HDL 41, LDL 51, TG 133  -Blood pressure control: as above  -Weight: BMI 34, counseled on lifestyle/ideal weight  Plan for follow up: 1 year or sooner PRN  Medication Adjustments/Labs and Tests Ordered: Current medicines are  reviewed at length with the patient today.  Concerns regarding medicines are outlined above.  Orders Placed This Encounter  Procedures  . EKG 12-Lead   No orders of the defined types were placed in this encounter.   Patient Instructions  Medication Instructions:  Stop: Niacin  -Will discuss Vascepa  based off lab results  If you need a refill on your cardiac medications before your next appointment, please call your pharmacy.   Lab work: Your physician recommends that you have lab work with primary care doctor.  Testing/Procedures: None  Follow-Up: At [facility], you and your health needs are our priority.  As part of our continuing mission to provide you with exceptional heart care, we have created designated Provider Care Teams.  These Care Teams include your primary Cardiologist (physician) and Advanced Practice Providers (APPs -  Physician Assistants and Nurse Practitioners) who all work together to provide you with the care you need, when you need it. You will need a follow up appointment in 1 years.  Please call our office 2 months in advance to schedule this appointment.  You may see Dr. Harrell Gave or one of the following Advanced Practice Providers on your designated Care Team:   Rosaria Ferries, PA-C . Jory Sims, DNP, ANP        Signed, Buford Dresser, MD PhD 10/18/2018  [place]

## 2018-10-18 NOTE — Patient Instructions (Signed)
Medication Instructions:  Stop: Niacin  -Will discuss Vascepa based off lab results  If you need a refill on your cardiac medications before your next appointment, please call your pharmacy.   Lab work: Your physician recommends that you have lab work with primary care doctor.  Testing/Procedures: None  Follow-Up: At [facility], you and your health needs are our priority.  As part of our continuing mission to provide you with exceptional heart care, we have created designated Provider Care Teams.  These Care Teams include your primary Cardiologist (physician) and Advanced Practice Providers (APPs -  Physician Assistants and Nurse Practitioners) who all work together to provide you with the care you need, when you need it. You will need a follow up appointment in 1 years.  Please call our office 2 months in advance to schedule this appointment.  You may see Dr. Harrell Gave or one of the following Advanced Practice Providers on your designated Care Team:   Rosaria Ferries, PA-C . Jory Sims, DNP, ANP

## 2018-10-26 ENCOUNTER — Encounter: Payer: Self-pay | Admitting: Cardiology

## 2018-10-26 DIAGNOSIS — I2510 Atherosclerotic heart disease of native coronary artery without angina pectoris: Secondary | ICD-10-CM | POA: Insufficient documentation

## 2018-10-26 DIAGNOSIS — E6609 Other obesity due to excess calories: Secondary | ICD-10-CM | POA: Insufficient documentation

## 2018-10-26 DIAGNOSIS — Z6834 Body mass index (BMI) 34.0-34.9, adult: Secondary | ICD-10-CM | POA: Insufficient documentation

## 2018-10-26 DIAGNOSIS — E782 Mixed hyperlipidemia: Secondary | ICD-10-CM | POA: Insufficient documentation

## 2018-10-26 DIAGNOSIS — E785 Hyperlipidemia, unspecified: Secondary | ICD-10-CM | POA: Insufficient documentation

## 2018-10-26 DIAGNOSIS — I10 Essential (primary) hypertension: Secondary | ICD-10-CM | POA: Insufficient documentation

## 2018-10-26 DIAGNOSIS — I447 Left bundle-branch block, unspecified: Secondary | ICD-10-CM | POA: Insufficient documentation

## 2018-11-21 ENCOUNTER — Other Ambulatory Visit: Payer: Self-pay

## 2018-11-21 ENCOUNTER — Other Ambulatory Visit: Payer: Self-pay | Admitting: Cardiology

## 2018-11-21 MED ORDER — HYDRALAZINE HCL 50 MG PO TABS: 50.0000 mg | ORAL_TABLET | Freq: Two times a day (BID) | ORAL | 1 refills | Status: DC

## 2018-11-23 ENCOUNTER — Other Ambulatory Visit: Payer: Self-pay

## 2018-11-29 DIAGNOSIS — M545 Low back pain: Secondary | ICD-10-CM | POA: Diagnosis not present

## 2018-12-01 DIAGNOSIS — Z23 Encounter for immunization: Secondary | ICD-10-CM | POA: Diagnosis not present

## 2018-12-09 ENCOUNTER — Telehealth: Payer: Self-pay | Admitting: *Deleted

## 2018-12-09 DIAGNOSIS — I10 Essential (primary) hypertension: Secondary | ICD-10-CM

## 2018-12-09 DIAGNOSIS — M5136 Other intervertebral disc degeneration, lumbar region: Secondary | ICD-10-CM | POA: Diagnosis not present

## 2018-12-09 DIAGNOSIS — E782 Mixed hyperlipidemia: Secondary | ICD-10-CM

## 2018-12-09 NOTE — Telephone Encounter (Signed)
Patient came in requesting lab orders for PCP that Dr Harrell Gave wanted patient to have. LP/CMET order placed and given to patient

## 2018-12-19 DIAGNOSIS — M5416 Radiculopathy, lumbar region: Secondary | ICD-10-CM | POA: Diagnosis not present

## 2018-12-30 DIAGNOSIS — M5416 Radiculopathy, lumbar region: Secondary | ICD-10-CM | POA: Diagnosis not present

## 2019-01-17 DIAGNOSIS — M5416 Radiculopathy, lumbar region: Secondary | ICD-10-CM | POA: Diagnosis not present

## 2019-01-17 DIAGNOSIS — G894 Chronic pain syndrome: Secondary | ICD-10-CM | POA: Diagnosis not present

## 2019-01-17 DIAGNOSIS — M545 Low back pain: Secondary | ICD-10-CM | POA: Diagnosis not present

## 2019-04-02 ENCOUNTER — Ambulatory Visit: Payer: Medicare Other

## 2019-04-08 ENCOUNTER — Ambulatory Visit: Payer: Medicare Other

## 2019-04-23 ENCOUNTER — Ambulatory Visit: Payer: Medicare Other

## 2019-09-14 ENCOUNTER — Telehealth: Payer: Self-pay | Admitting: *Deleted

## 2019-09-14 NOTE — Telephone Encounter (Signed)
A detailed message was left.re follow up visit.

## 2019-11-14 ENCOUNTER — Other Ambulatory Visit: Payer: Self-pay

## 2019-11-14 ENCOUNTER — Encounter: Payer: Self-pay | Admitting: Cardiology

## 2019-11-14 ENCOUNTER — Ambulatory Visit: Payer: Medicare Other | Admitting: Cardiology

## 2019-11-14 VITALS — BP 140/75 | HR 54 | Temp 95.0°F | Wt 188.8 lb

## 2019-11-14 DIAGNOSIS — E7800 Pure hypercholesterolemia, unspecified: Secondary | ICD-10-CM

## 2019-11-14 DIAGNOSIS — I447 Left bundle-branch block, unspecified: Secondary | ICD-10-CM

## 2019-11-14 DIAGNOSIS — I2510 Atherosclerotic heart disease of native coronary artery without angina pectoris: Secondary | ICD-10-CM

## 2019-11-14 DIAGNOSIS — I10 Essential (primary) hypertension: Secondary | ICD-10-CM | POA: Diagnosis not present

## 2019-11-14 DIAGNOSIS — Z7189 Other specified counseling: Secondary | ICD-10-CM

## 2019-11-14 NOTE — Patient Instructions (Addendum)
Medication Instructions:  When you go home: -are you taking metoprolol 1/2 tablet twice daily; written as 50 mg tab, 1/2 pill twice daily -are you taking a whole pill or half pill of hydralazine twice a day; written at 50 mg twice daily -have you ever been on amlodipine (norvasc) before? -have you ever taken carvedilol (coreg) before?  *If you need a refill on your cardiac medications before your next appointment, please call your pharmacy*   Lab Work: None ordered  Testing/Procedures: None ordered    Follow-Up: At [facility], you and your health needs are our priority.  As part of our continuing mission to provide you with exceptional heart care, we have created designated Provider Care Teams.  These Care Teams include your primary Cardiologist (physician) and Advanced Practice Providers (APPs -  Physician Assistants and Nurse Practitioners) who all work together to provide you with the care you need, when you need it.  We recommend signing up for the patient portal called "MyChart".  Sign up information is provided on this After Visit Summary.  MyChart is used to connect with patients for Virtual Visits (Telemedicine).  Patients are able to view lab/test results, encounter notes, upcoming appointments, etc.  Non-urgent messages can be sent to your provider as well.   To learn more about what you can do with MyChart, go to [email or web address].    Your next appointment:   9 month(s)  The format for your next appointment:   In Person  Provider:   Buford Dresser, MD

## 2019-11-14 NOTE — Progress Notes (Signed)
Cardiology Office Note:    Date:  11/14/2019   ID:  Kristin Allen, DOB 28-Jul-1945, MRN [id number]  PCP:  Kristin Solian, MD  Cardiologist:  Kristin Dresser, MD PhD (prior Dr. Wynonia Allen)  Referring MD: Kristin Solian, MD   CC: follow up  History of Present Illness:    Kristin Allen is a 74 y.o. female with a hx of CAD, hypertension, hyperlipidemia. She was previously followed by Dr. Wynonia Allen. Has a history of CAD, first MI at age 77 (in 17) and then CABG in 1998. Had cath with Dr. Wynonia Allen in 2011, had PCI to SVG at that time. Her anginal equivalent is left arm pain. Only her father had history of MI.  Today: Reviewed note sent from Dr. Dagmar Allen. We discussed the stress of this continued pandemic.  We reviewed medication list today. She does not think she is taking the clopidogrel any more. Has not had stent in years. She takes metoprolol 1/2 tab twice daily. She is unsure if she takes the hydralazine whole or half pill twice daily. She is taking olmesartan 40 mg daily. She is unclear if she has taken amlodipine in the past.   Blood pressure has been similar to today's numbers at other doctor's visits. Doesn't check BP at home.   Denies chest pain, shortness of breath at rest or with normal exertion. No PND, orthopnea, LE edema or unexpected weight gain. No syncope or palpitations.  Past Medical History:  Diagnosis Date  . Anxiety   . CAD (coronary artery disease)    positive test in 2011 with one stent placed in Dr Kristin Allen and colleagues  . Glucose intolerance (malabsorption)   . Heart disease   . HTN (hypertension)   . Hypothyroidism   . Peripheral neuropathy    followed by Dr Kristin Allen every 5 years    Past Surgical History:  Procedure Laterality Date  . [place]   by pass  . CHOLECYSTECTOMY  1979  . CORONARY ARTERY BYPASS GRAFT  1999  . stint  2010  . TOTAL ABDOMINAL HYSTERECTOMY W/ BILATERAL SALPINGOOPHORECTOMY  1979    Current Medications: Current  Outpatient Medications on File Prior to Visit  Medication Sig  . Acetaminophen (TYLENOL EXTRA STRENGTH PO) Take by mouth. 500-25mg , take one tablet qhs prn  . ALPRAZolam (XANAX) 0.5 MG tablet Take 0.5 mg by mouth 2 (two) times daily.   Marland Kitchen aspirin EC 81 MG tablet Take 81 mg by mouth daily.  Marland Kitchen atorvastatin (LIPITOR) 40 MG tablet Take 40 mg by mouth daily.  . Calcium Carbonate-Vitamin D (CALTRATE 600+D PO) Take by mouth 2 (two) times daily.  . clopidogrel (PLAVIX) 75 MG tablet Take 75 mg by mouth daily.  Marland Kitchen FLUZONE HIGH-DOSE 0.5 ML SUSY   . furosemide (LASIX) 40 MG tablet Take 40 mg by mouth. Take 2 tablets every am, 1 tablet every pm  . gabapentin (NEURONTIN) 300 MG capsule Take 300 mg by mouth 4 (four) times daily.   . hydrALAZINE (APRESOLINE) 50 MG tablet TAKE 1 TABLET(50 MG) BY MOUTH TWICE DAILY  . HYDROcodone-acetaminophen (NORCO/VICODIN) 5-325 MG per tablet Take 1 tablet by mouth every 6 (six) hours as needed for moderate pain. 1-2 every 6 hours as needed  . levothyroxine (SYNTHROID) 50 MCG tablet Take 50 mcg by mouth daily.  . metoprolol (LOPRESSOR) 50 MG tablet Take 50 mg by mouth 2 (two) times daily. Take 1/2 tablet twice daily  . olmesartan (BENICAR) 40 MG tablet Take 40 mg by mouth  daily.   No current facility-administered medications on file prior to visit.     Allergies:   Demerol [meperidine]   Social History   Tobacco Use  . Smoking status: Former Research scientist (life sciences)  . Smokeless tobacco: Never Used  Substance Use Topics  . Alcohol use: Yes    Alcohol/week: 0.0 standard drinks    Comment: wine  . Drug use: No    Family History: The patient's family history includes Aneurysm in her sister; Anxiety disorder in her sister; CAD in her father; CVA in her father; Diabetes Mellitus I in her sister; Heart attack in her father; Heart disease in her father; Hypertension in her father.  ROS:   Please see the history of present illness.  Additional pertinent ROS otherwise  unremarkable.  EKGs/Labs/Other Studies Reviewed:    The following studies were reviewed today: Cath 05/24/2009 LV gram with EF 45%.  Natives: LM: 60-70% ostial LCx: occluded LAD: occluded after diagonal RCA: proximally occluded Grafts: SVG-RCA-PDA: widely patent SVG-OM: 99% proximal graft stenosis, PCI to this lesion LIMA-LAD-Diag: widely patent  Lexiscan 02/28/2009 Mild breast attenuation Moderately severe fixed defect in anteroseptal region consistent with prior MI No ischemia  Echo 02/20/2009 LV EF 40-45% Mild cLVH Hypokinesis of anterior, apex, lateral, septal walls No hemodynamically significant valve disease (Mild MR, trace TR)  EKG:  EKG is personally reviewed.  The ekg ordered today demonstrates sinus bradycardia with PVC, LBBB, rate 54 bpm  Recent Labs: No results found for requested labs within last 8760 hours.  Recent Lipid Panel No results found for: CHOL, TRIG, HDL, CHOLHDL, VLDL, LDLCALC, LDLDIRECT  Physical Exam:    VS:  BP 140/75   Pulse (!) 54   Temp (!) 95 F (35 C)   Wt 188 lb 12.8 oz (85.6 kg)   SpO2 96%   BMI 34.53 kg/m     Wt Readings from Last 3 Encounters:  11/14/19 188 lb 12.8 oz (85.6 kg)  10/18/18 189 lb 3.2 oz (85.8 kg)  07/16/18 201 lb (91.2 kg)   GEN: Well nourished, well developed in no acute distress HEENT: Normal, moist mucous membranes NECK: No JVD CARDIAC: regular rhythm, normal S1 and S2, no rubs or gallops. No murmur. VASCULAR: Radial and DP pulses 2+ bilaterally. No carotid bruits RESPIRATORY:  Clear to auscultation without rales, wheezing or rhonchi  ABDOMEN: Soft, non-tender, non-distended MUSCULOSKELETAL:  Ambulates independently SKIN: Warm and dry, no edema NEUROLOGIC:  Alert and oriented x 3. No focal neuro deficits noted. PSYCHIATRIC:  Normal affect   ASSESSMENT:    1. Essential hypertension   2. Coronary artery disease involving native heart without angina pectoris, unspecified vessel or lesion type   3.  Pure hypercholesterolemia   4. LBBB (left bundle branch block)   5. Cardiac risk counseling   6. Counseling on health promotion and disease prevention    PLAN:    CAD s/p CABG: no angina, though activity limited -on aspirin, thinks she is no longer taking clopidogrel. Will remove from list today. -on high intensity statin -on beta blocker and ARB  Hyperlipidemia:  -per [facility], lipids 04/10/19: Tchol 113, HDL 43, LDL 48 -LDL goal <70, at goal  -continue atorvastatin 40 mg.  Hypertension, with reported hypertensive heart disease per notes:  -above goal today -we have discussed home monitoring, not currently performing -her medication regimen is unclear. She will go home and review what is on her bottles, then call and let us know -continue furosemide 80 mg in AM and 40 mg in  PM -continue hydralazine 50 mg BID. If she has not been on amlodipine before, would change this to amlodipine -continue metoprolol 25 mg BID. Consider changing this to carvedilol for better BP control -continue olmesartan 40 mg daily -given CAD, consider spironolactone in the future  LBBB: not new  Secondary prevention recommendations: -recommend heart healthy/Mediterranean diet, with whole grains, fruits, vegetable, fish, lean meats, nuts, and olive oil. Limit salt. -recommend moderate walking, 3-5 times/week for 30-50 minutes each session. Aim for at least 150 minutes.week. Goal should be pace of 3 miles/hours, or walking 1.5 miles in 30 minutes -recommend avoidance of tobacco products. Avoid excess alcohol. -Additional risk factor control:  -Diabetes: No formal diagnosis, A1c is 5.4 per [facility]  -Weight: BMI 34, counseled on lifestyle/ideal weight  Plan for follow up: 9 mos (stagger with Dr. Dagmar Allen) or sooner as needed  Medication Adjustments/Labs and Tests Ordered: Current medicines are reviewed at length with the patient today.  Concerns regarding medicines are outlined above.  Orders Placed This Encounter   Procedures  . EKG 12-Lead   No orders of the defined types were placed in this encounter.   Patient Instructions   Medication Instructions:  When you go home: -are you taking metoprolol 1/2 tablet twice daily; written as 50 mg tab, 1/2 pill twice daily -are you taking a whole pill or half pill of hydralazine twice a day; written at 50 mg twice daily -have you ever been on amlodipine (norvasc) before? -have you ever taken carvedilol (coreg) before?  *If you need a refill on your cardiac medications before your next appointment, please call your pharmacy*   Lab Work: None ordered  Testing/Procedures: None ordered    Follow-Up: At [facility], you and your health needs are our priority.  As part of our continuing mission to provide you with exceptional heart care, we have created designated Provider Care Teams.  These Care Teams include your primary Cardiologist (physician) and Advanced Practice Providers (APPs -  Physician Assistants and Nurse Practitioners) who all work together to provide you with the care you need, when you need it.  We recommend signing up for the patient portal called "MyChart".  Sign up information is provided on this After Visit Summary.  MyChart is used to connect with patients for Virtual Visits (Telemedicine).  Patients are able to view lab/test results, encounter notes, upcoming appointments, etc.  Non-urgent messages can be sent to your provider as well.   To learn more about what you can do with MyChart, go to [email or web address].    Your next appointment:   9 month(s)  The format for your next appointment:   In Person  Provider:   Buford Dresser, MD      Signed, Kristin Dresser, MD PhD 11/14/2019  [place]

## 2019-11-16 NOTE — Progress Notes (Signed)
Cardiology Office Note   Date:  11/17/2019   ID:  Kristin Allen, DOB 1945-06-15, MRN [id number]  PCP:  Prince Solian, MD  Cardiologist:  Buford Dresser, MD EP: None  Chief Complaint  Patient presents with  . Hypertension      History of Present Illness: Kristin Allen is a 73 y.o. female with PMH of CAD s/p CABG (first MI at age 79 in 25, followed by CABG in 1998, with subsequent PCI to SVG-OM in 2011), chronic combined CHF (suspect ICM), HTN, HLD, and hypothyroidism, who presents for follow-up of her blood pressure.  She was recently seen by Dr. Harrell Gave 11/14/19, at which time she appeared to be doing well from a cardiac standpoint, though there was some confusion as to what medications she was taking at home and her blood pressure was consistently above goal during recent office visits.She was ultimately instructed to go home, review her medications, then contact the office to confirm her regimen.  She presents today for follow-up of her HTN. She reviewed her medications at home and reports she was taking metoprolol 25mg  qAM and 12.5mg  qPM and hydralazine 25mg  qAM and 12.5mg  qPM. She reports being on amloidpine 10mg  daily, though this was stopped several years ago for unknown reasons; she does not recall any complications. She reports blood pressures are typically in the 140s/70s. She has no complaints of chest pain, SOB, DOE, palpitations, LE edema, orthopnea, dizziness, lightheadedness, syncope, or HA today.    Past Medical History:  Diagnosis Date  . Anxiety   . CAD (coronary artery disease)    positive test in 2011 with one stent placed in Dr Wynonia Lawman and colleagues  . Glucose intolerance (malabsorption)   . Heart disease   . HTN (hypertension)   . Hypothyroidism   . Peripheral neuropathy    followed by Dr Erling Cruz every 5 years    Past Surgical History:  Procedure Laterality Date  . [place]   by pass  . CHOLECYSTECTOMY  1979  . CORONARY  ARTERY BYPASS GRAFT  1999  . stint  2010  . TOTAL ABDOMINAL HYSTERECTOMY W/ BILATERAL SALPINGOOPHORECTOMY  1979     Current Outpatient Medications  Medication Sig Dispense Refill  . Acetaminophen (TYLENOL EXTRA STRENGTH PO) Take by mouth. 500-25mg , take one tablet qhs prn    . ALPRAZolam (XANAX) 0.5 MG tablet Take 0.5 mg by mouth 2 (two) times daily.     Marland Kitchen aspirin EC 81 MG tablet Take 81 mg by mouth daily.    Marland Kitchen atorvastatin (LIPITOR) 40 MG tablet Take 40 mg by mouth daily.    . Calcium Carbonate-Vitamin D (CALTRATE 600+D PO) Take by mouth 2 (two) times daily.    Marland Kitchen FLUZONE HIGH-DOSE 0.5 ML SUSY   0  . furosemide (LASIX) 40 MG tablet Take 40 mg by mouth. Take 2 tablets every am, 1 tablet every pm    . gabapentin (NEURONTIN) 300 MG capsule Take 300 mg by mouth 4 (four) times daily.     Marland Kitchen HYDROcodone-acetaminophen (NORCO/VICODIN) 5-325 MG per tablet Take 1 tablet by mouth every 6 (six) hours as needed for moderate pain. 1-2 every 6 hours as needed    . levothyroxine (SYNTHROID) 50 MCG tablet Take 50 mcg by mouth daily.    Marland Kitchen olmesartan (BENICAR) 40 MG tablet Take 40 mg by mouth daily.    Marland Kitchen amLODipine (NORVASC) 5 MG tablet Take 1 tablet (5 mg total) by mouth daily. 90 tablet 3  .  carvedilol (COREG) 12.5 MG tablet Take 1 tablet (12.5 mg total) by mouth 2 (two) times daily. 60 tablet 3   No current facility-administered medications for this visit.    Allergies:   Demerol [meperidine]    Social History:  The patient  reports that she has quit smoking. She has never used smokeless tobacco. She reports current alcohol use. She reports that she does not use drugs.   Family History:  The patient's family history includes Aneurysm in her sister; Anxiety disorder in her sister; CAD in her father; CVA in her father; Diabetes Mellitus I in her sister; Heart attack in her father; Heart disease in her father; Hypertension in her father.    ROS:  Please see the history of present illness.   Otherwise,  review of systems are positive for none.   All other systems are reviewed and negative.    PHYSICAL EXAM: VS:  BP (!) 171/75   Pulse (!) 54   Ht 5\' 2"  (1.575 m)   Wt 189 lb (85.7 kg)   SpO2 94%   BMI 34.57 kg/m  , BMI Body mass index is 34.57 kg/m. GEN: Well nourished, well developed, in no acute distress HEENT: sclera anicteric Neck: no JVD, carotid bruits, or masses Cardiac: bradycardic, regular rhythm; no murmurs, rubs, or gallops, no edema  Respiratory:  clear to auscultation bilaterally, normal work of breathing GI: soft, obese, nontender, nondistended, + BS MS: no deformity or atrophy Skin: warm and dry, no rash Neuro:  Strength and sensation are intact Psych: euthymic mood, full affect   EKG:  EKG is not ordered today.   Recent Labs: No results found for requested labs within last 8760 hours.    Lipid Panel No results found for: CHOL, TRIG, HDL, CHOLHDL, VLDL, LDLCALC, LDLDIRECT    Wt Readings from Last 3 Encounters:  11/17/19 189 lb (85.7 kg)  11/14/19 188 lb 12.8 oz (85.6 kg)  10/18/18 189 lb 3.2 oz (85.8 kg)      Other studies Reviewed: Additional studies/ records that were reviewed today include:   Scanned echocardiogram from 2010 and cardiac catheterization from 2011 reviewed.     ASSESSMENT AND PLAN:  1. CAD s/p CABG with subsequent PCI to SVG-OM in 2011: no anginal complaints - Continue aspirin and statin - Continue BBlocker  2. HTN: BP 158/70 today on my repeat. Goal <130/80 - Will stop metoprolol and hydralazine  - Start carvedilol 12.5mg  BID (limited in titration due to bradycardia)  - Will start amlodipine 5mg  daily - Continue olmesartan and lasix - Encouraged low sodium diet - Will plan for a pharmacy visit in 2 weeks for HTN management - patient instructed to keep a log of her BP morning and night to bring to this appointment   3. Chronic combined CHF: EF 40-45% on remote echocardiogram in 2011. No recent cardiac imaging/testing.  Volume status appears stable on lasix 80mg  qAM and 40mg  qPM - Continue BBlocker, olmesartan, and lasix - Continue to limit salt intake of <2g per day and monitoring daily weights  4. HLD: LDL 48 04/2019 - Continue atorvastatin  5. Hypothyroidism: managed by PCP - Continue levothyroxine  6. LBBB: chronic    Current medicines are reviewed at length with the patient today.  The patient does not have concerns regarding medicines.  The following changes have been made:  As above  Labs/ tests ordered today include:  No orders of the defined types were placed in this encounter.    Disposition:  FU with pharmacy visit for HTN in 2 weeks and Dr. Harrell Gave in 9 months as previously recommended.    Signed, Abigail Butts, PA-C  11/17/2019 12:44 PM

## 2019-11-17 ENCOUNTER — Ambulatory Visit: Payer: Medicare Other | Admitting: Medical

## 2019-11-17 ENCOUNTER — Encounter: Payer: Self-pay | Admitting: Medical

## 2019-11-17 ENCOUNTER — Other Ambulatory Visit: Payer: Self-pay

## 2019-11-17 VITALS — BP 171/75 | HR 54 | Ht 62.0 in | Wt 189.0 lb

## 2019-11-17 DIAGNOSIS — I2510 Atherosclerotic heart disease of native coronary artery without angina pectoris: Secondary | ICD-10-CM | POA: Diagnosis not present

## 2019-11-17 DIAGNOSIS — E039 Hypothyroidism, unspecified: Secondary | ICD-10-CM

## 2019-11-17 DIAGNOSIS — I5042 Chronic combined systolic (congestive) and diastolic (congestive) heart failure: Secondary | ICD-10-CM

## 2019-11-17 DIAGNOSIS — E785 Hyperlipidemia, unspecified: Secondary | ICD-10-CM | POA: Diagnosis not present

## 2019-11-17 DIAGNOSIS — I447 Left bundle-branch block, unspecified: Secondary | ICD-10-CM

## 2019-11-17 DIAGNOSIS — I10 Essential (primary) hypertension: Secondary | ICD-10-CM

## 2019-11-17 MED ORDER — AMLODIPINE BESYLATE 5 MG PO TABS: 5.0000 mg | ORAL_TABLET | Freq: Every day | ORAL | 3 refills | Status: DC

## 2019-11-17 MED ORDER — CARVEDILOL 12.5 MG PO TABS: 12.5000 mg | ORAL_TABLET | Freq: Two times a day (BID) | ORAL | 3 refills | Status: DC

## 2019-11-17 NOTE — Patient Instructions (Signed)
Medication Instructions:  Start Amlodipine 5 mg ( Take 1 Tablet Daily) and Carvedilol 12.5 mg ( 1 Tablet Twice Daily). Discontinue Hydralazine and Metoprolol. *If you need a refill on your cardiac medications before your next appointment, please call your pharmacy*   Lab Work: None  If you have labs (blood work) drawn today and your tests are completely normal, you will receive your results only by: Marland Kitchen MyChart Message (if you have MyChart) OR . A paper copy in the mail If you have any lab test that is abnormal or we need to change your treatment, we will call you to review the results.   Testing/Procedures: None    Follow-Up: At [facility], you and your health needs are our priority.  As part of our continuing mission to provide you with exceptional heart care, we have created designated Provider Care Teams.  These Care Teams include your primary Cardiologist (physician) and Advanced Practice Providers (APPs -  Physician Assistants and Nurse Practitioners) who all work together to provide you with the care you need, when you need it.  We recommend signing up for the patient portal called "MyChart".  Sign up information is provided on this After Visit Summary.  MyChart is used to connect with patients for Virtual Visits (Telemedicine).  Patients are able to view lab/test results, encounter notes, upcoming appointments, etc.  Non-urgent messages can be sent to your provider as well.   To learn more about what you can do with MyChart, go to [email or web address].    Your next appointment:   9 month(s)  The format for your next appointment:   In Person  Provider:   Buford Dresser, MD   Other Instructions Follow up in 2 Weeks with the Pharmacy for BP Check. Log Daily Blood Pressure. Follow Low Sodium diet

## 2019-12-04 ENCOUNTER — Ambulatory Visit (INDEPENDENT_AMBULATORY_CARE_PROVIDER_SITE_OTHER): Payer: Medicare Other | Admitting: Pharmacist Clinician (PhC)/ Clinical Pharmacy Specialist

## 2019-12-04 ENCOUNTER — Other Ambulatory Visit: Payer: Self-pay

## 2019-12-04 DIAGNOSIS — I10 Essential (primary) hypertension: Secondary | ICD-10-CM | POA: Diagnosis not present

## 2019-12-04 NOTE — Assessment & Plan Note (Signed)
Patient with essential hypertension, doing better with the switch to carvedilol and amlodipine.  Home readings average just over 784 systolic, but will not make any changes to her meds at this time.  She does have mild lower extremity edema, however notes this is no better/worse that before starting amlodipine.  She was encouraged to use her compression socks year around.  She should continue with regular monitoring 3-4 days each week and call the office should she note readings trend upward consistently.

## 2019-12-04 NOTE — Progress Notes (Signed)
12/04/2019 VALINA MAES November 20, 1945 [id number]   HPI:  RYLANN MUNFORD is a 74 y.o. female patient of Dr Harrell Gave, with a [place] below who presents today for hypertension clinic evaluation.  She was last seen by Roby Lofts about 3 weeks ago and found to have a blood pressure of 171/75.  She had previously seen Dr. Harrell Gave earlier that week, but was unsure of her medication names and doses.   It was asked that she update med list and return in a short time to evaluate effectiveness of her routine.  Daleen Snook changed her metoprolol to carvedilol 12.5 mg bid, stopped the hydralazine and added amlodipine 5 mg to her regimen.    She returns today for follow up.  Has been checking home BP readings twice daily for most of the past 2 weeks. (see below).  No side effects from medications or concerns today.    Past Medical History: ASCVD S/p MI - first in 94 (age 37), CABG 1998, PCI to SVG-OM 2011  CHF Chronic combined - on furosemide, carvedilol, olmesartan  hyperlipidemia 2/21:TC 113, TG 108, HDL 43, LDL 48 - on atorvastatin 40 mg   hypothyroidism 2/21: TSH 1.68 - on levothyroxine 50 mcg     Blood Pressure Goal:  130/80  Current Medications:  Amlodipine 5 mg, carvedilol 12.5 mg bid, olmesartan 40  Family Hx: father with heart disease, died in late 84's, alcohol, MI, first MI around 89; 1 sister with brain aneurysm, but no cardiac issues; 3 children, oldest with hypertension  Social Hx: no, no, 1 coffee per day  Diet: cooks once or twice weekly, some leftovers, otherwise eats out; prefers fruits/vegetbles; not much fried foods  Exercise: no regular - neuropathy problematic - goes to stores and leans on cart   Home BP readings:  Home cuff 50-48 years old - husband using as well - checked with Dr. Dagmar Hait recently.  15 morning readings average 132/73; 11 evening readings average 137/74  Intolerances: nkda  Labs:  2/21:  Na 134, K 4.2, Glu 94, BUN 14, SCr 0.7  Wt Readings from Last  3 Encounters:  12/04/19 188 lb 9.6 oz (85.5 kg)  11/17/19 189 lb (85.7 kg)  11/14/19 188 lb 12.8 oz (85.6 kg)   BP Readings from Last 3 Encounters:  12/04/19 124/82  11/17/19 (!) 171/75  11/14/19 140/75   Pulse Readings from Last 3 Encounters:  12/04/19 60  11/17/19 (!) 54  11/14/19 (!) 54    Current Outpatient Medications  Medication Sig Dispense Refill  . Acetaminophen (TYLENOL EXTRA STRENGTH PO) Take by mouth. 500-25mg , take one tablet qhs prn    . ALPRAZolam (XANAX) 0.5 MG tablet Take 0.5 mg by mouth 2 (two) times daily.     Marland Kitchen amLODipine (NORVASC) 5 MG tablet Take 1 tablet (5 mg total) by mouth daily. 90 tablet 3  . aspirin EC 81 MG tablet Take 81 mg by mouth daily.    Marland Kitchen atorvastatin (LIPITOR) 40 MG tablet Take 40 mg by mouth daily.    . Calcium Carbonate-Vitamin D (CALTRATE 600+D PO) Take by mouth 2 (two) times daily.    . carvedilol (COREG) 12.5 MG tablet Take 1 tablet (12.5 mg total) by mouth 2 (two) times daily. 60 tablet 3  . FLUZONE HIGH-DOSE 0.5 ML SUSY   0  . furosemide (LASIX) 40 MG tablet Take 40 mg by mouth. Take 2 tablets every am, 1 tablet every pm    . gabapentin (NEURONTIN) 300 MG capsule  Take 300 mg by mouth 4 (four) times daily.     Marland Kitchen HYDROcodone-acetaminophen (NORCO/VICODIN) 5-325 MG per tablet Take 1 tablet by mouth every 6 (six) hours as needed for moderate pain. 1-2 every 6 hours as needed    . levothyroxine (SYNTHROID) 50 MCG tablet Take 50 mcg by mouth daily.    Marland Kitchen olmesartan (BENICAR) 40 MG tablet Take 40 mg by mouth daily.     No current facility-administered medications for this visit.    Allergies  Allergen Reactions  . Demerol [Meperidine]     hallucinations    Past Medical History:  Diagnosis Date  . Anxiety   . CAD (coronary artery disease)    positive test in 2011 with one stent placed in Dr Wynonia Lawman and colleagues  . Glucose intolerance (malabsorption)   . Heart disease   . HTN (hypertension)   . Hypothyroidism   . Peripheral  neuropathy    followed by Dr Erling Cruz every 5 years    Blood pressure 124/82, pulse 60, resp. rate 16, height 5\' 3"  (1.6 m), weight 188 lb 9.6 oz (85.5 kg), SpO2 96 %.  Essential hypertension Patient with essential hypertension, doing better with the switch to carvedilol and amlodipine.  Home readings average just over 818 systolic, but will not make any changes to her meds at this time.  She does have mild lower extremity edema, however notes this is no better/worse that before starting amlodipine.  She was encouraged to use her compression socks year around.  She should continue with regular monitoring 3-4 days each week and call the office should she note readings trend upward consistently.     Tommy Medal PharmD CPP [facility] [address] [place], [place] [address] [phone]

## 2019-12-04 NOTE — Patient Instructions (Signed)
Check your blood pressure at home daily and keep record of the readings.  Take your BP meds as follows:   Continue with all current medications  Bring all of your meds, your BP cuff and your record of home blood pressures to your next appointment.  Exercise as you're able, try to walk approximately 30 minutes per day.  Keep salt intake to a minimum, especially watch canned and prepared boxed foods.  Eat more fresh fruits and vegetables and fewer canned items.  Avoid eating in fast food restaurants.    HOW TO TAKE YOUR BLOOD PRESSURE: . Rest 5 minutes before taking your blood pressure. .  Don't smoke or drink caffeinated beverages for at least 30 minutes before. . Take your blood pressure before (not after) you eat. . Sit comfortably with your back supported and both feet on the floor (don't cross your legs). . Elevate your arm to heart level on a table or a desk. . Use the proper sized cuff. It should fit smoothly and snugly around your bare upper arm. There should be enough room to slip a fingertip under the cuff. The bottom edge of the cuff should be 1 inch above the crease of the elbow. . Ideally, take 3 measurements at one sitting and record the average.

## 2019-12-25 LAB — IFOBT (OCCULT BLOOD): IFOBT: NEGATIVE

## 2020-01-12 ENCOUNTER — Telehealth: Payer: Self-pay | Admitting: Cardiology

## 2020-01-12 NOTE — Telephone Encounter (Signed)
Pt c/o swelling: STAT is pt has developed SOB within 24 hours  1) How much weight have you gained and in what time span? Has not really gained any weight   2) If swelling, where is the swelling located? Left leg, foot, arm, and ankle  3) Are you currently taking a fluid pill? Yes   4) Are you currently SOB? No   5) Do you have a log of your daily weights (if so, list)? No   6) Have you gained 3 pounds in a day or 5 pounds in a week? No   7) Have you traveled recently? No  States it started occurring right after last visit.

## 2020-01-12 NOTE — Telephone Encounter (Signed)
Since the last office visit, patient has been having swelling more so on the left side.  Her blood pressure medication was adjusted, metoprolol was discontinued between the last office visit and a switch to carvedilol and addition of amlodipine.  I recommend she decrease amlodipine to 2.5 mg daily.  She will take 80 mg twice daily dosing of Lasix for today and tomorrow before going back to the previous dose of 80 mg a.m. and 40 mg p.m.  I recommend a earlier visit with Dr. Harrell Gave, this has been arranged for next Tuesday to review her blood pressure and take another look at her left side of swelling to make sure there is low possibility of DVT.  Otherwise she is asymptomatic.

## 2020-01-16 ENCOUNTER — Ambulatory Visit: Payer: Medicare Other | Admitting: Cardiology

## 2020-01-16 ENCOUNTER — Encounter: Payer: Self-pay | Admitting: Cardiology

## 2020-01-16 ENCOUNTER — Other Ambulatory Visit: Payer: Self-pay

## 2020-01-16 VITALS — BP 134/62 | HR 62 | Ht 62.0 in | Wt 190.4 lb

## 2020-01-16 DIAGNOSIS — Z951 Presence of aortocoronary bypass graft: Secondary | ICD-10-CM

## 2020-01-16 DIAGNOSIS — I10 Essential (primary) hypertension: Secondary | ICD-10-CM | POA: Diagnosis not present

## 2020-01-16 DIAGNOSIS — I2510 Atherosclerotic heart disease of native coronary artery without angina pectoris: Secondary | ICD-10-CM

## 2020-01-16 DIAGNOSIS — R600 Localized edema: Secondary | ICD-10-CM

## 2020-01-16 DIAGNOSIS — I447 Left bundle-branch block, unspecified: Secondary | ICD-10-CM

## 2020-01-16 DIAGNOSIS — E785 Hyperlipidemia, unspecified: Secondary | ICD-10-CM

## 2020-01-16 DIAGNOSIS — I255 Ischemic cardiomyopathy: Secondary | ICD-10-CM

## 2020-01-16 NOTE — Patient Instructions (Signed)

## 2020-01-16 NOTE — Progress Notes (Signed)
Cardiology Office Note:    Date:  01/16/2020   ID:  Kristin Allen, DOB 1945/08/19, MRN [id number]  PCP:  Prince Solian, MD  Cardiologist:  Buford Dresser, MD PhD (prior Dr. Wynonia Lawman)  Referring MD: Prince Solian, MD   CC: follow up  History of Present Illness:    Kristin Allen is a 74 y.o. female with a hx of CAD, hypertension, hyperlipidemia. She was previously followed by Dr. Wynonia Lawman. Has a history of CAD, first MI at age 8 (in 71) and then CABG in 1998. Had cath with Dr. Wynonia Lawman in 2011, had PCI to SVG at that time. Her anginal equivalent is left arm pain. Only her father had history of MI.  Today:  Seen for interim follow up for LE edema. Reviewed telephone notes related to this. Weights have been stable. Since our last visit, has noticed swelling in legs, more left than right, and also swelling in the left wrist area. The left wrist is swollen differently, though she notes mild chronic swelling. This is constant, not positional or related to time of day.   No recent travel.  BP has been variable, one day 155/92, 150/80. Then 134/79, 110/68 the next day.  Has been taking amlodpine 2.5 mg daily and furosemide 40 mg BID since 11/12. Hasn't noticed much difference. Wearing more tight knee high socks as compression stockings leave too much of a dent in her leg. We discussed options, will plan to return to prior lasix dose.   Denies chest pain, shortness of breath at rest or with normal exertion. No PND, orthopnea, or unexpected weight gain. No syncope or palpitations.  Past Medical History:  Diagnosis Date  . Anxiety   . CAD (coronary artery disease)    positive test in 2011 with one stent placed in Dr Wynonia Lawman and colleagues  . Glucose intolerance (malabsorption)   . Heart disease   . HTN (hypertension)   . Hypothyroidism   . Peripheral neuropathy    followed by Dr Erling Cruz every 5 years    Past Surgical History:  Procedure Laterality Date  . [place]   by pass  . CHOLECYSTECTOMY  1979  . CORONARY ARTERY BYPASS GRAFT  1999  . stint  2010  . TOTAL ABDOMINAL HYSTERECTOMY W/ BILATERAL SALPINGOOPHORECTOMY  1979    Current Medications: Current Outpatient Medications on File Prior to Visit  Medication Sig  . Acetaminophen (TYLENOL EXTRA STRENGTH PO) Take by mouth. 500-25mg , take one tablet qhs prn  . ALPRAZolam (XANAX) 0.5 MG tablet Take 0.5 mg by mouth 2 (two) times daily.   Marland Kitchen amLODipine (NORVASC) 5 MG tablet Take 1 tablet (5 mg total) by mouth daily.  Marland Kitchen aspirin EC 81 MG tablet Take 81 mg by mouth daily.  Marland Kitchen atorvastatin (LIPITOR) 40 MG tablet Take 40 mg by mouth daily.  . Calcium Carbonate-Vitamin D (CALTRATE 600+D PO) Take by mouth 2 (two) times daily.  . carvedilol (COREG) 12.5 MG tablet Take 1 tablet (12.5 mg total) by mouth 2 (two) times daily.  Marland Kitchen FLUZONE HIGH-DOSE 0.5 ML SUSY   . furosemide (LASIX) 40 MG tablet Take 40 mg by mouth. Take 2 tablets every am, 1 tablet every pm  . gabapentin (NEURONTIN) 300 MG capsule Take 300 mg by mouth 4 (four) times daily.   Marland Kitchen HYDROcodone-acetaminophen (NORCO/VICODIN) 5-325 MG per tablet Take 1 tablet by mouth every 6 (six) hours as needed for moderate pain. 1-2 every 6 hours as needed  . levothyroxine (SYNTHROID) 50  MCG tablet Take 50 mcg by mouth daily.  Marland Kitchen olmesartan (BENICAR) 40 MG tablet Take 40 mg by mouth daily.   No current facility-administered medications on file prior to visit.     Allergies:   Demerol [meperidine]   Social History   Tobacco Use  . Smoking status: Former Research scientist (life sciences)  . Smokeless tobacco: Never Used  Substance Use Topics  . Alcohol use: Yes    Alcohol/week: 0.0 standard drinks    Comment: wine  . Drug use: No    Family History: The patient's family history includes Aneurysm in her sister; Anxiety disorder in her sister; CAD in her father; CVA in her father; Diabetes Mellitus I in her sister; Heart attack in her father; Heart disease in her father; Hypertension  in her father.  ROS:   Please see the history of present illness.  Additional pertinent ROS otherwise unremarkable.  EKGs/Labs/Other Studies Reviewed:    The following studies were reviewed today: Cath 05/24/2009 LV gram with EF 45%.  Natives: LM: 60-70% ostial LCx: occluded LAD: occluded after diagonal RCA: proximally occluded Grafts: SVG-RCA-PDA: widely patent SVG-OM: 99% proximal graft stenosis, PCI to this lesion LIMA-LAD-Diag: widely patent  Lexiscan 02/28/2009 Mild breast attenuation Moderately severe fixed defect in anteroseptal region consistent with prior MI No ischemia  Echo 02/20/2009 LV EF 40-45% Mild cLVH Hypokinesis of anterior, apex, lateral, septal walls No hemodynamically significant valve disease (Mild MR, trace TR)  EKG:  EKG is personally reviewed.  The ekg ordered 11/14/19 demonstrates sinus bradycardia with PVC, LBBB, rate 54 bpm  Recent Labs: No results found for requested labs within last 8760 hours.  Recent Lipid Panel No results found for: CHOL, TRIG, HDL, CHOLHDL, VLDL, LDLCALC, LDLDIRECT  Physical Exam:    VS:  BP 134/62   Pulse 62   Ht 5\' 2"  (1.575 m)   Wt 190 lb 6.4 oz (86.4 kg)   BMI 34.82 kg/m     Wt Readings from Last 3 Encounters:  01/16/20 190 lb 6.4 oz (86.4 kg)  12/04/19 188 lb 9.6 oz (85.5 kg)  11/17/19 189 lb (85.7 kg)   GEN: Well nourished, well developed in no acute distress HEENT: Normal, moist mucous membranes NECK: No JVD CARDIAC: regular rhythm, normal S1 and S2, no rubs or gallops. No murmur. VASCULAR: Radial and DP pulses 2+ bilaterally. No carotid bruits RESPIRATORY:  Clear to auscultation without rales, wheezing or rhonchi  ABDOMEN: Soft, non-tender, non-distended MUSCULOSKELETAL:  Ambulates independently SKIN: Warm and dry, trivial LLE edema and trivial-1+ RLE edema NEUROLOGIC:  Alert and oriented x 3. No focal neuro deficits noted. PSYCHIATRIC:  Normal affect   ASSESSMENT:    1. Bilateral leg edema    2. Coronary artery disease involving native heart without angina pectoris, unspecified vessel or lesion type   3. Hx of CABG   4. Essential hypertension   5. Hyperlipidemia LDL goal <70   6. LBBB (left bundle branch block)   7. Ischemic cardiomyopathy    PLAN:    LE edema: not severe, good pulses, no cords/pain/warmth -return to prior lasix dosing -continue to monitor -discussed compression, elevation, salt avoidance -may be due to amlodipine, though she is on a very low dose  CAD s/p CABG Ischemic cardiomyopathy, EF 40-45% -on aspirin -on high intensity statin -on beta blocker and ARB -denies angina  Hyperlipidemia:  -per KPN, lipids 04/10/19: Tchol 113, HDL 43, LDL 48 -LDL goal <70, at goal  -continue atorvastatin 40 mg.  Hypertension, with reported hypertensive heart disease  per notes:  -near goal today -reviewed home BP monitoring -continue furosemide 80 mg in AM and 40 mg in PM -continue amlodipine 2.5 mg daily -continue carvedilol 12.5 mg BID -continue olmesartan 40 mg daily -given CAD, consider spironolactone in the future if potassium/renal function allows  LBBB: not new  Secondary prevention recommendations: -recommend heart healthy/Mediterranean diet, with whole grains, fruits, vegetable, fish, lean meats, nuts, and olive oil. Limit salt. -recommend moderate walking, 3-5 times/week for 30-50 minutes each session. Aim for at least 150 minutes.week. Goal should be pace of 3 miles/hours, or walking 1.5 miles in 30 minutes -recommend avoidance of tobacco products. Avoid excess alcohol. -Additional risk factor control:  -Diabetes: No formal diagnosis, A1c is 5.4 per KPN  -Weight: BMI 34, counseled on lifestyle/ideal weight  Plan for follow up: 6 mos or sooner as needed. Instructed to contact me if edema worsens  Medication Adjustments/Labs and Tests Ordered: Current medicines are reviewed at length with the patient today.  Concerns regarding medicines are  outlined above.  No orders of the defined types were placed in this encounter.  No orders of the defined types were placed in this encounter.   Patient Instructions  Medication Instructions:  Your Physician recommend you continue on your current medication as directed.    *If you need a refill on your cardiac medications before your next appointment, please call your pharmacy*   Lab Work: None   Testing/Procedures: None   Follow-Up: At [facility], you and your health needs are our priority.  As part of our continuing mission to provide you with exceptional heart care, we have created designated Provider Care Teams.  These Care Teams include your primary Cardiologist (physician) and Advanced Practice Providers (APPs -  Physician Assistants and Nurse Practitioners) who all work together to provide you with the care you need, when you need it.  We recommend signing up for the patient portal called "MyChart".  Sign up information is provided on this After Visit Summary.  MyChart is used to connect with patients for Virtual Visits (Telemedicine).  Patients are able to view lab/test results, encounter notes, upcoming appointments, etc.  Non-urgent messages can be sent to your provider as well.   To learn more about what you can do with MyChart, go to [email or web address].    Your next appointment:   6 month(s)  The format for your next appointment:   In Person  Provider:   Buford Dresser, MD        Signed, Buford Dresser, MD PhD 01/16/2020  [place]

## 2020-02-12 ENCOUNTER — Other Ambulatory Visit: Payer: Self-pay | Admitting: Medical

## 2020-02-12 NOTE — Telephone Encounter (Signed)
This is Dr. Christopher's pt 

## 2020-02-14 NOTE — Telephone Encounter (Signed)
Rx has been sent to the pharmacy electronically. ° °

## 2020-03-19 ENCOUNTER — Other Ambulatory Visit: Payer: Self-pay | Admitting: Medical

## 2020-04-21 ENCOUNTER — Encounter: Payer: Self-pay | Admitting: Cardiology

## 2020-04-21 DIAGNOSIS — Z951 Presence of aortocoronary bypass graft: Secondary | ICD-10-CM | POA: Insufficient documentation

## 2020-04-21 DIAGNOSIS — I255 Ischemic cardiomyopathy: Secondary | ICD-10-CM | POA: Insufficient documentation

## 2020-08-07 ENCOUNTER — Other Ambulatory Visit: Payer: Self-pay

## 2020-08-07 ENCOUNTER — Encounter: Payer: Self-pay | Admitting: Cardiology

## 2020-08-07 ENCOUNTER — Ambulatory Visit: Payer: Medicare Other | Admitting: Cardiology

## 2020-08-07 VITALS — BP 155/85 | HR 66 | Ht 62.0 in | Wt 189.6 lb

## 2020-08-07 DIAGNOSIS — R600 Localized edema: Secondary | ICD-10-CM

## 2020-08-07 DIAGNOSIS — I10 Essential (primary) hypertension: Secondary | ICD-10-CM

## 2020-08-07 DIAGNOSIS — Z951 Presence of aortocoronary bypass graft: Secondary | ICD-10-CM | POA: Diagnosis not present

## 2020-08-07 DIAGNOSIS — I255 Ischemic cardiomyopathy: Secondary | ICD-10-CM

## 2020-08-07 DIAGNOSIS — I447 Left bundle-branch block, unspecified: Secondary | ICD-10-CM

## 2020-08-07 DIAGNOSIS — E785 Hyperlipidemia, unspecified: Secondary | ICD-10-CM

## 2020-08-07 DIAGNOSIS — I2510 Atherosclerotic heart disease of native coronary artery without angina pectoris: Secondary | ICD-10-CM | POA: Diagnosis not present

## 2020-08-07 DIAGNOSIS — I5042 Chronic combined systolic (congestive) and diastolic (congestive) heart failure: Secondary | ICD-10-CM

## 2020-08-07 NOTE — Progress Notes (Signed)
Cardiology Office Note:    Date:  08/29/2020   ID:  Kristin Allen, DOB November 24, 1945, MRN [id number]  PCP:  Prince Solian, MD  Cardiologist:  Buford Dresser, MD PhD (prior Dr. Wynonia Lawman)  Referring MD: Prince Solian, MD   CC: follow up  History of Present Illness:    Kristin Allen is a 75 y.o. female with a hx of CAD, hypertension, hyperlipidemia. She was previously followed by Dr. Wynonia Lawman. Has a history of CAD, first MI at age 78 (in 73) and then CABG in 1998. Had cath with Dr. Wynonia Lawman in 2011, had PCI to SVG at that time. Her anginal equivalent is left arm pain. Only her father had history of MI.  Today: She states she is not feeling okay. Her husband has been in the hospital for the past 9 weeks and he is not doing well. She visits him daily. Lately she has had infrequent panic attacks with correlating shortness of breath. Her regimen includes Xanax as needed for these panic attacks.  She presents a blood pressure log today. She is unsure if she can tell when her blood pressure is too high or low. Range of recent numbers with her increased stress is 136/75-155/98 in the last week.  For her diet, she is not eating when at the hospital, and is eating more fast food otherwise. She will also eat tomato sandwiches sometimes.  She endorses neuropathy. She denies any chest pain, shortness of breath, palpitations, or exertional symptoms. No headaches, lightheadedness, or syncope to report. Also has no lower extremity edema, orthopnea or PND.   Past Medical History:  Diagnosis Date   Anxiety    CAD (coronary artery disease)    positive test in 2011 with one stent placed in Dr Wynonia Lawman and colleagues   Glucose intolerance (malabsorption)    Heart disease    HTN (hypertension)    Hypothyroidism    Peripheral neuropathy    followed by Dr Erling Cruz every 5 years    Past Surgical History:  Procedure Laterality Date   [place]   by pass   [place]   stint  2010   TOTAL ABDOMINAL HYSTERECTOMY W/ BILATERAL SALPINGOOPHORECTOMY  1979    Current Medications: Current Outpatient Medications on File Prior to Visit  Medication Sig   Acetaminophen (TYLENOL EXTRA STRENGTH PO) Take by mouth. 500-25mg , take one tablet qhs prn   ALPRAZolam (XANAX) 0.5 MG tablet Take 0.5 mg by mouth 2 (two) times daily.    amLODipine (NORVASC) 5 MG tablet Take 2.5 mg by mouth daily.   aspirin EC 81 MG tablet Take 81 mg by mouth daily.   atorvastatin (LIPITOR) 40 MG tablet Take 40 mg by mouth daily.   Calcium Carbonate-Vitamin D (CALTRATE 600+D PO) Take by mouth 2 (two) times daily.   carvedilol (COREG) 12.5 MG tablet TAKE 1 TABLET(12.5 MG) BY MOUTH TWICE DAILY   escitalopram (LEXAPRO) 5 MG tablet Take 5 mg by mouth daily.   FLUZONE HIGH-DOSE 0.5 ML SUSY    furosemide (LASIX) 40 MG tablet Take 40 mg by mouth. Take 2 tablets every am, 1 tablet every pm   gabapentin (NEURONTIN) 300 MG capsule Take 300 mg by mouth 4 (four) times daily.    HYDROcodone-acetaminophen (NORCO/VICODIN) 5-325 MG per tablet Take 1 tablet by mouth every 6 (six) hours as needed for moderate pain. 1-2 every 6 hours as needed   levothyroxine (SYNTHROID) 50 MCG tablet  Take 50 mcg by mouth daily.   olmesartan (BENICAR) 40 MG tablet Take 40 mg by mouth daily.   No current facility-administered medications on file prior to visit.     Allergies:   Demerol [meperidine]   Social History   Tobacco Use   Smoking status: Former    Pack years: 0.00   Smokeless tobacco: Never  Substance Use Topics   Alcohol use: Yes    Alcohol/week: 0.0 standard drinks    Comment: wine   Drug use: No    Family History: The patient's family history includes Aneurysm in her sister; Anxiety disorder in her sister; CAD in her father; CVA in her father; Diabetes Mellitus I in her sister; Heart attack in her father; Heart disease in her father; Hypertension in her father.  ROS:    Please see the history of present illness.   (+) Stress/Anxiety (+) Panic Attacks (+) Tearful (+) Shortness of breath Additional pertinent ROS otherwise unremarkable.  EKGs/Labs/Other Studies Reviewed:    The following studies were reviewed today:  [place] LE Venous 07/20/2018: Summary:  Left: There is no evidence of deep vein thrombosis in the lower  extremity.There is no evidence of superficial venous thrombosis.  US Aorta 08/03/2017: FINDINGS: Abdominal aortic measurements as follows:   Proximal:  2.5 cm Mid:  3.2 cm in transverse dimension Distal:  2.0 cm   IMPRESSION: Mild ectasia of the infrarenal aorta. Recommend followup by ultrasound in 3 years. This recommendation follows ACR consensus guidelines: White Paper of the ACR Incidental Findings Committee II on Vascular Findings. Joellyn Rued Radiol 2013; [phone]  Cath 05/24/2009 LV gram with EF 45%.  Natives: LM: 60-70% ostial LCx: occluded LAD: occluded after diagonal RCA: proximally occluded Grafts: SVG-RCA-PDA: widely patent SVG-OM: 99% proximal graft stenosis, PCI to this lesion LIMA-LAD-Diag: widely patent  Lexiscan 02/28/2009 Mild breast attenuation Moderately severe fixed defect in anteroseptal region consistent with prior MI No ischemia  Echo 02/20/2009 LV EF 40-45% Mild cLVH Hypokinesis of anterior, apex, lateral, septal walls No hemodynamically significant valve disease (Mild MR, trace TR)  EKG:    08/07/2020: EKG is not ordered today. 11/14/19: sinus bradycardia with PVC, LBBB, rate 54 bpm  Recent Labs: No results found for requested labs within last 8760 hours.  Recent Lipid Panel No results found for: CHOL, TRIG, HDL, CHOLHDL, VLDL, LDLCALC, LDLDIRECT  Physical Exam:    VS:  BP (!) 155/85   Pulse 66   Ht 5\' 2"  (1.575 m)   Wt 189 lb 9.6 oz (86 kg)   SpO2 98%   BMI 34.68 kg/m     Wt Readings from Last 3 Encounters:  08/07/20 189 lb 9.6 oz (86 kg)  01/16/20 190 lb 6.4 oz (86.4 kg)   12/04/19 188 lb 9.6 oz (85.5 kg)   GEN: Well nourished, well developed in no acute distress HEENT: Tearful. Normal, moist mucous membranes NECK: No JVD CARDIAC: regular rhythm, normal S1 and S2, no rubs or gallops. No murmur. VASCULAR: Radial and DP pulses 2+ bilaterally. No carotid bruits RESPIRATORY:  Clear to auscultation without rales, wheezing or rhonchi  ABDOMEN: Soft, non-tender, non-distended MUSCULOSKELETAL:  Ambulates independently SKIN: Warm and dry, trivial bilateral LE edema NEUROLOGIC:  Alert and oriented x 3. No focal neuro deficits noted. PSYCHIATRIC:  Normal affect   ASSESSMENT:    1. Essential hypertension   2. Bilateral leg edema   3. Coronary artery disease involving native heart without angina pectoris, unspecified vessel or lesion type   4. Hx  of CABG   5. Hyperlipidemia LDL goal <70   6. LBBB (left bundle branch block)   7. Ischemic cardiomyopathy   8. Chronic combined systolic and diastolic CHF (congestive heart failure) (HCC)    PLAN:    LE edema:  -improved from prior, but increased salt in diet recently may be exacerbating -continue furosemide -discussed compression, elevation, salt avoidance -may be due to amlodipine, though she is on a very low dose  CAD s/p CABG Ischemic cardiomyopathy, EF 40-45% -on aspirin -on high intensity statin -on beta blocker and ARB -denies angina  Hyperlipidemia:  -per KPN, lipids 04/17/20: Tchol 122, HDL 47, LDL 55, TG 100 -LDL goal <70, at goal  -continue atorvastatin 40 mg.  Hypertension, with reported hypertensive heart disease per notes:  -has been more elevated recently, with husband in the hospital, limited activity, change in diet -reviewed home BP monitoring -continue furosemide 80 mg in AM and 40 mg in PM -continue amlodipine 2.5 mg daily -continue carvedilol 12.5 mg BID -continue olmesartan 40 mg daily -given CAD, consider spironolactone in the future if potassium/renal function allows -she will  work on stress management and lifestyle as much as she can in the interim. Will call if BP continues to be significantly elevated  LBBB: not new  Secondary prevention recommendations: -recommend heart healthy/Mediterranean diet, with whole grains, fruits, vegetable, fish, lean meats, nuts, and olive oil. Limit salt. -recommend moderate walking, 3-5 times/week for 30-50 minutes each session. Aim for at least 150 minutes.week. Goal should be pace of 3 miles/hours, or walking 1.5 miles in 30 minutes -recommend avoidance of tobacco products. Avoid excess alcohol. -Additional risk factor control:  -Diabetes: No formal diagnosis, A1c is 5.4 per KPN  -Weight: BMI 34, counseled on lifestyle/ideal weight  Plan for follow up: 3 mos or sooner as needed.  Medication Adjustments/Labs and Tests Ordered: Current medicines are reviewed at length with the patient today.  Concerns regarding medicines are outlined above.  No orders of the defined types were placed in this encounter.  No orders of the defined types were placed in this encounter.   Patient Instructions  Medication Instructions:  Your Physician recommend you continue on your current medication as directed.    *If you need a refill on your cardiac medications before your next appointment, please call your pharmacy*   Lab Work: None ordered today   Testing/Procedures: None ordered today   Follow-Up: At [facility], you and your health needs are our priority.  As part of our continuing mission to provide you with exceptional heart care, we have created designated Provider Care Teams.  These Care Teams include your primary Cardiologist (physician) and Advanced Practice Providers (APPs -  Physician Assistants and Nurse Practitioners) who all work together to provide you with the care you need, when you need it.  We recommend signing up for the patient portal called "MyChart".  Sign up information is provided on this After Visit  Summary.  MyChart is used to connect with patients for Virtual Visits (Telemedicine).  Patients are able to view lab/test results, encounter notes, upcoming appointments, etc.  Non-urgent messages can be sent to your provider as well.   To learn more about what you can do with MyChart, go to [email or web address].    Your next appointment:   3 month(s) @ [address] [place], [place] [address]   The format for your next appointment:   In Person  Provider:   Buford Dresser, MD     [facility]  Stumpf,acting as a Education administrator for [employer], MD.,have documented all relevant documentation on the behalf of Buford Dresser, MD,as directed by  Buford Dresser, MD while in the presence of Buford Dresser, MD.  I, Buford Dresser, MD, have reviewed all documentation for this visit. The documentation on 08/29/20 for the exam, diagnosis, procedures, and orders are all accurate and complete.   Signed, Buford Dresser, MD PhD 08/29/2020  [facility]

## 2020-08-07 NOTE — Patient Instructions (Signed)
Medication Instructions:  Your Physician recommend you continue on your current medication as directed.    *If you need a refill on your cardiac medications before your next appointment, please call your pharmacy*   Lab Work: None ordered today   Testing/Procedures: None ordered today   Follow-Up: At [facility], you and your health needs are our priority.  As part of our continuing mission to provide you with exceptional heart care, we have created designated Provider Care Teams.  These Care Teams include your primary Cardiologist (physician) and Advanced Practice Providers (APPs -  Physician Assistants and Nurse Practitioners) who all work together to provide you with the care you need, when you need it.  We recommend signing up for the patient portal called "MyChart".  Sign up information is provided on this After Visit Summary.  MyChart is used to connect with patients for Virtual Visits (Telemedicine).  Patients are able to view lab/test results, encounter notes, upcoming appointments, etc.  Non-urgent messages can be sent to your provider as well.   To learn more about what you can do with MyChart, go to [email or web address].    Your next appointment:   3 month(s) @ [address] [place], [place] [address]   The format for your next appointment:   In Person  Provider:   Bridgette Christopher, MD     

## 2020-08-29 ENCOUNTER — Encounter: Payer: Self-pay | Admitting: Cardiology

## 2020-10-16 IMAGING — DX LEFT FOOT - COMPLETE 3+ VIEW
3 series · 3 of 3 positions shown · non-contrast
Comparison: None.

CLINICAL DATA: 73-year-old with peripheral neuropathy presenting
with pain, bruising and swelling involving the LEFT foot localizing
to the base of the second through fourth toes. No known injuries.

EXAM:
LEFT FOOT - COMPLETE 3+ VIEW

[foot ap]
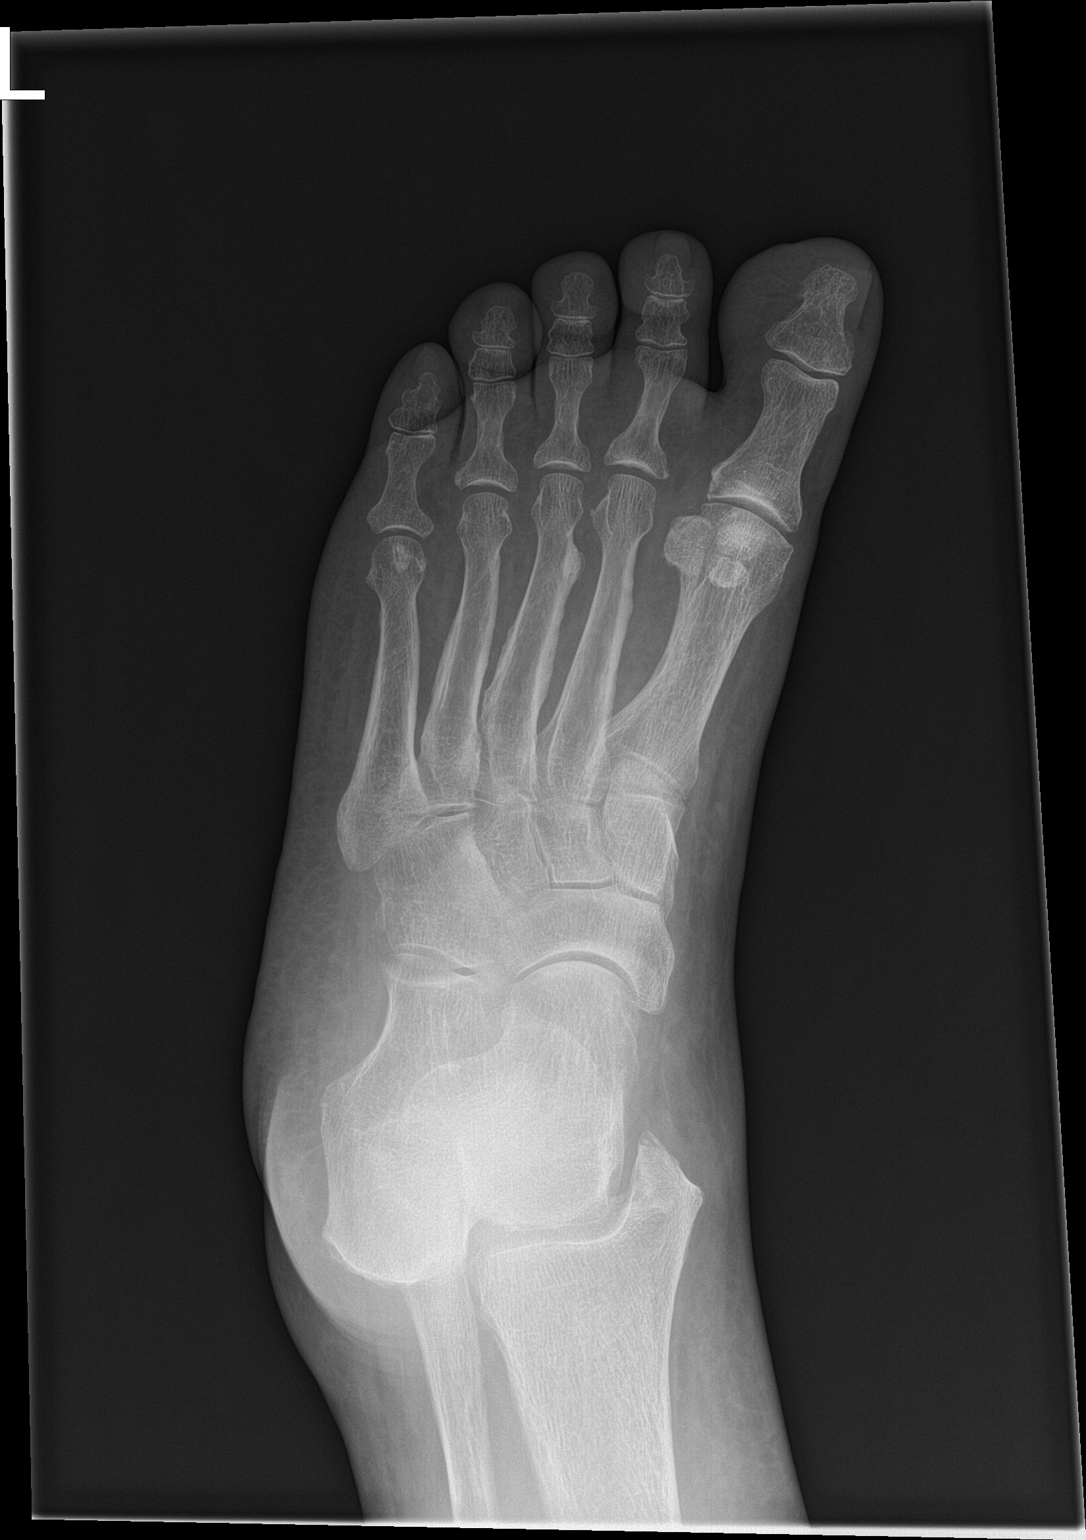

[foot obl]
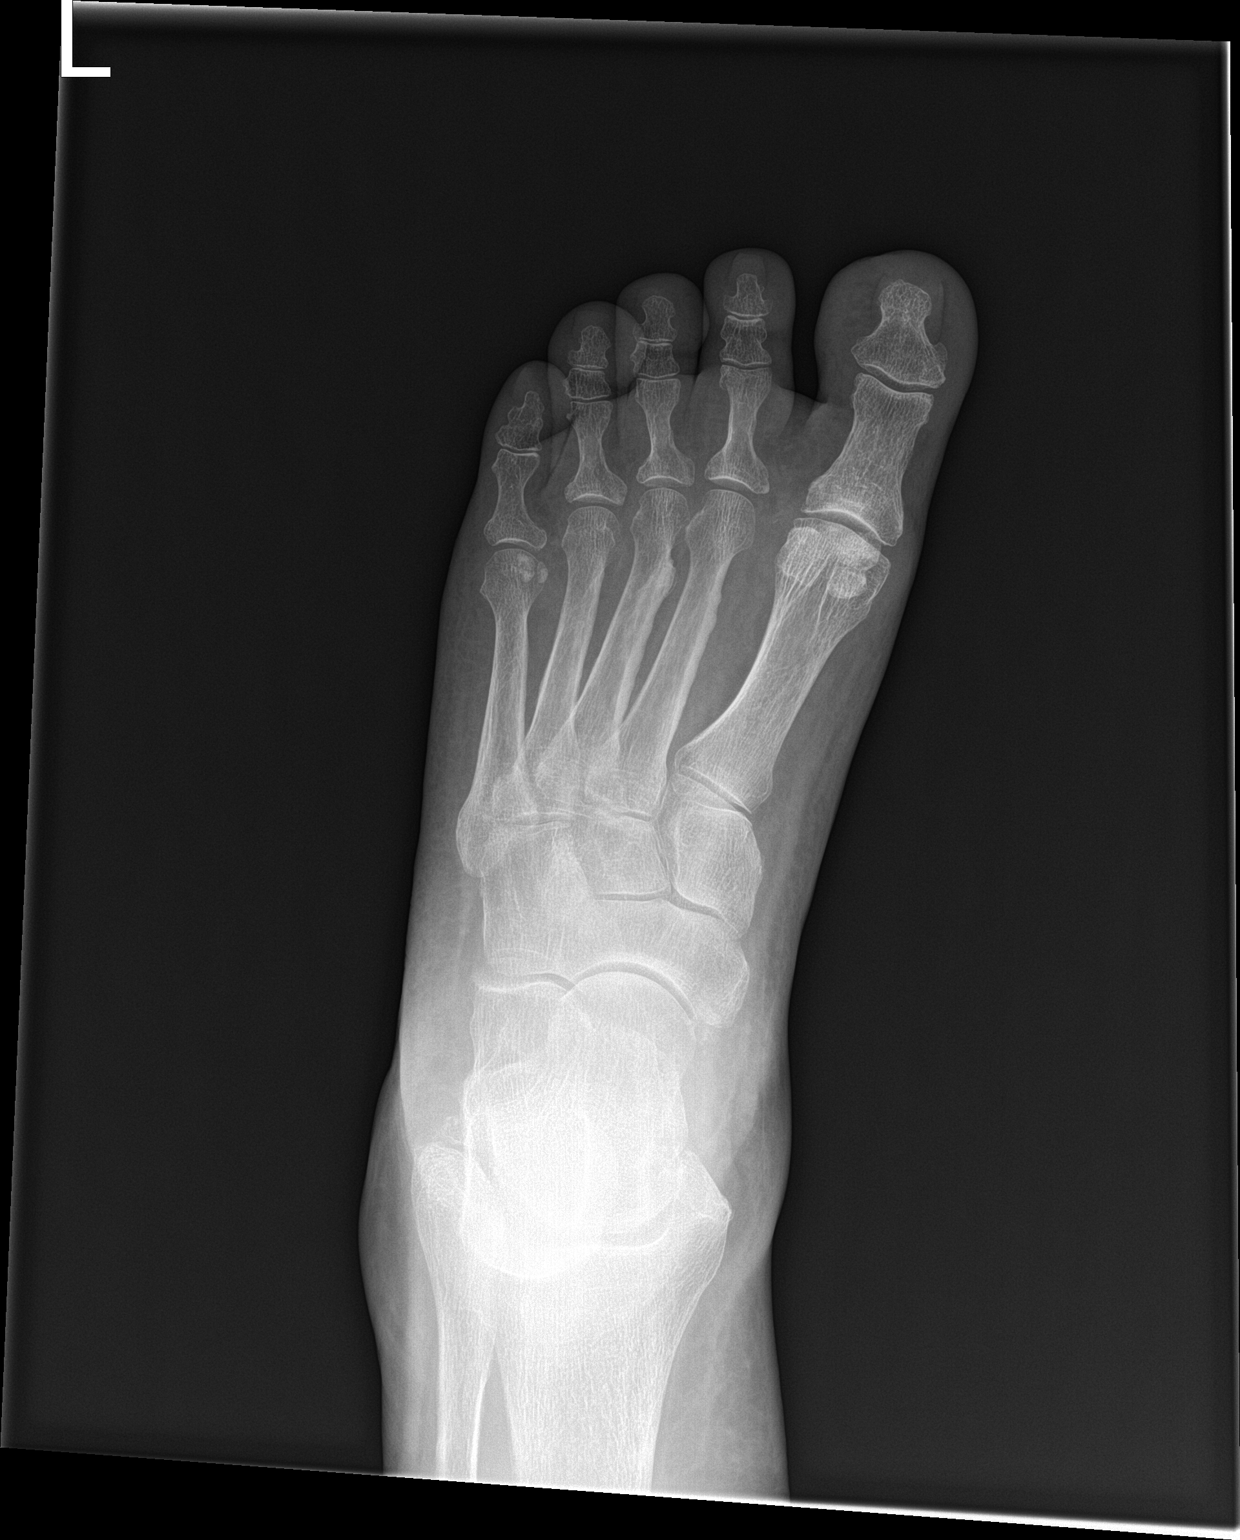

[foot lat]
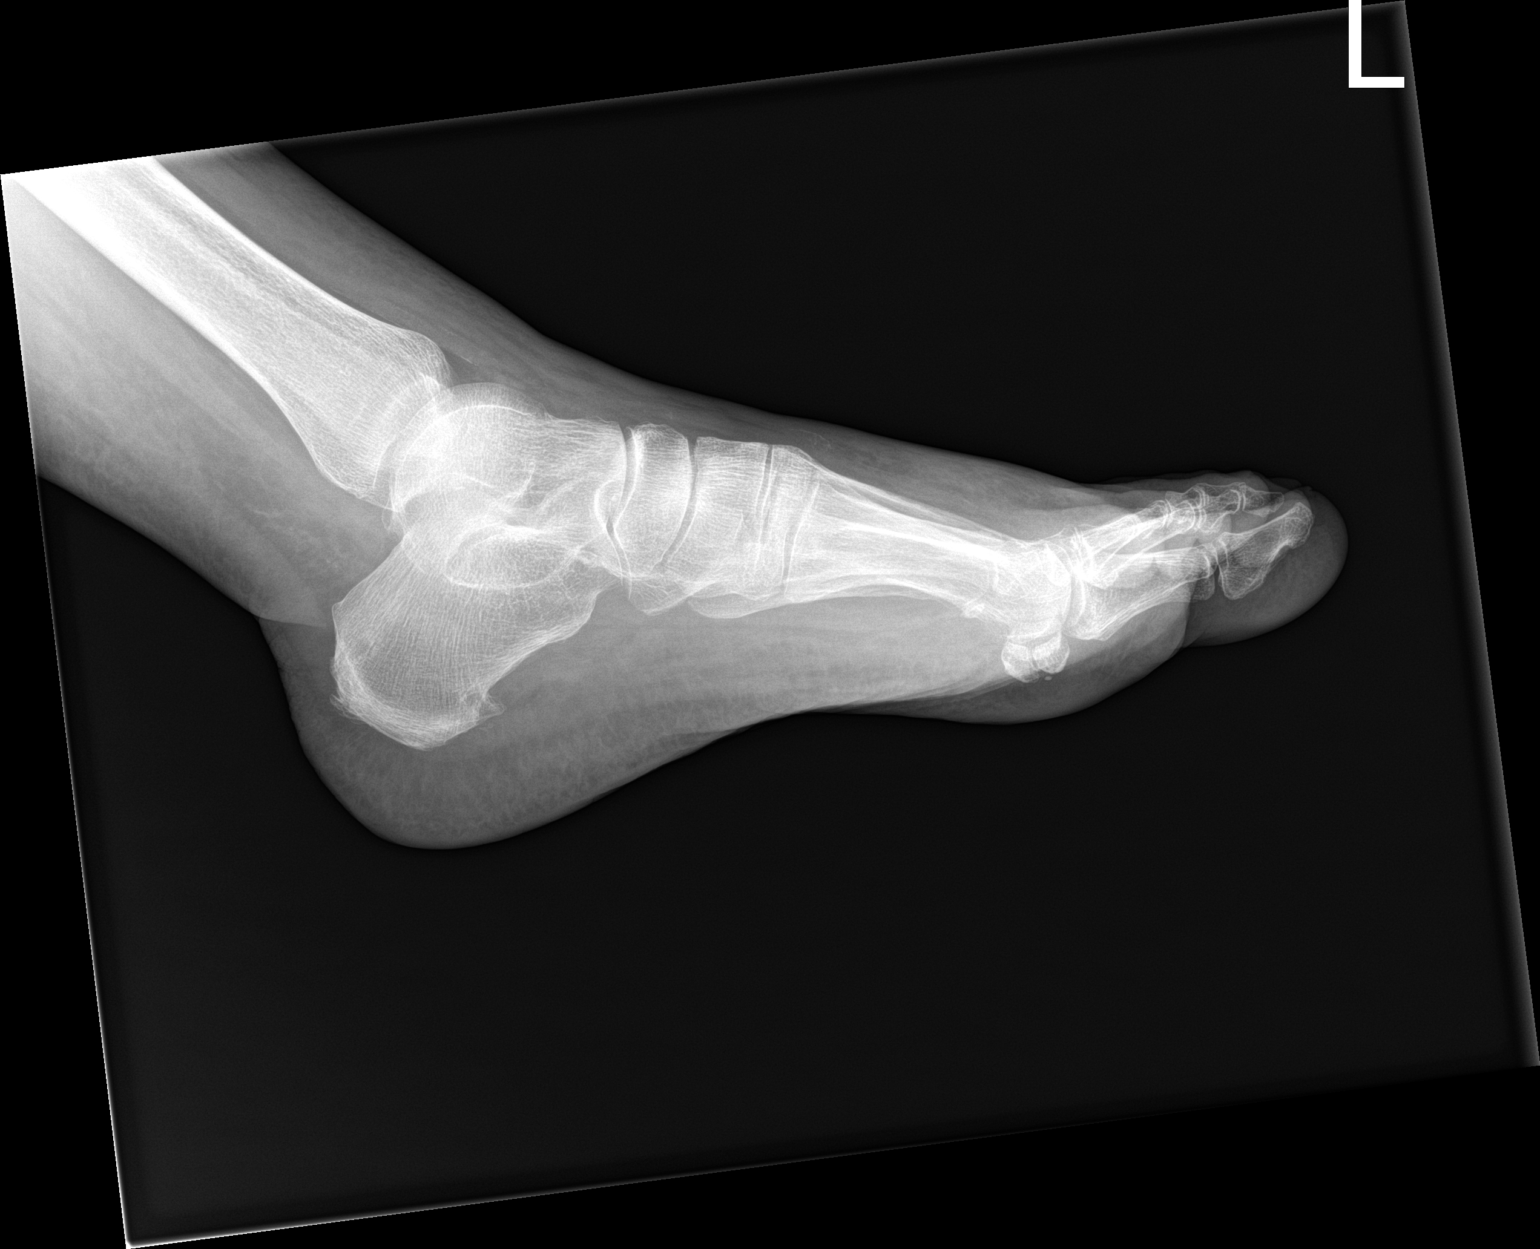

[3 of 3 positions shown; findings below may reference images not displayed]

FINDINGS: Osseous demineralization. No evidence of acute or subacute fracture.
Mild narrowing of the IP joint spaces of the second through fourth
toes. Remaining joint spaces well-preserved. No erosions. Cortical
thickening involving the MEDIAL cortex of the second and third
metatarsals. Small plantar calcaneal spur. Mild DORSAL soft tissue
swelling overlying the metatarsal heads.
IMPRESSION: 1. No acute or subacute osseous abnormality.
2. Osseous demineralization.
3. Mild osteoarthritis involving the IP joints of the second through
fourth toes.
4. Cortical thickening involving the second and third metatarsals
which may indicate a chronic stress reaction.

## 2020-10-30 ENCOUNTER — Ambulatory Visit: Payer: Medicare Other | Admitting: Podiatry

## 2020-10-30 ENCOUNTER — Other Ambulatory Visit: Payer: Self-pay

## 2020-10-30 DIAGNOSIS — R202 Paresthesia of skin: Secondary | ICD-10-CM | POA: Diagnosis not present

## 2020-10-30 DIAGNOSIS — R200 Anesthesia of skin: Secondary | ICD-10-CM

## 2020-10-30 DIAGNOSIS — M792 Neuralgia and neuritis, unspecified: Secondary | ICD-10-CM | POA: Diagnosis not present

## 2020-10-31 NOTE — Progress Notes (Signed)
Subjective:  Patient ID: Kristin Allen, female    DOB: 09-04-1945,  MRN: [id number]  Chief Complaint  Patient presents with   Numbness    Bilateral neuropathy     75 y.o. female presents with the above complaint.  Patient presents with complaint of bilateral neuropathic pain to both lower extremity.  Patient states has burning tingling mostly at nighttime.  She is taking gabapentin for the pain which helps sometimes but it has progressed to gotten worse.  She has had this neuropathy for quite some time.  She does not recall the exact etiology of neuropathy.  She is not a diabetic.  She has tried some topical medication none of which has helped.  She would like to discuss other treatment options for neuropathy.   Review of Systems: Negative except as noted in the HPI. Denies N/V/F/Ch.  Past Medical History:  Diagnosis Date   Anxiety    CAD (coronary artery disease)    positive test in 2011 with one stent placed in Dr Wynonia Lawman and colleagues   Glucose intolerance (malabsorption)    Heart disease    HTN (hypertension)    Hypothyroidism    Peripheral neuropathy    followed by Dr Erling Cruz every 5 years    Current Outpatient Medications:    Acetaminophen (TYLENOL EXTRA STRENGTH PO), Take by mouth. 500-'25mg'$ , take one tablet qhs prn, Disp: , Rfl:    ALPRAZolam (XANAX) 0.5 MG tablet, Take 0.5 mg by mouth 2 (two) times daily. , Disp: , Rfl:    amLODipine (NORVASC) 5 MG tablet, Take 2.5 mg by mouth daily., Disp: , Rfl:    aspirin EC 81 MG tablet, Take 81 mg by mouth daily., Disp: , Rfl:    atorvastatin (LIPITOR) 40 MG tablet, Take 40 mg by mouth daily., Disp: , Rfl:    Calcium Carbonate-Vitamin D (CALTRATE 600+D PO), Take by mouth 2 (two) times daily., Disp: , Rfl:    carvedilol (COREG) 12.5 MG tablet, TAKE 1 TABLET(12.5 MG) BY MOUTH TWICE DAILY, Disp: 180 tablet, Rfl: 2   escitalopram (LEXAPRO) 5 MG tablet, Take 5 mg by mouth daily., Disp: , Rfl:    FLUZONE HIGH-DOSE 0.5 ML SUSY, , Disp: ,  Rfl: 0   furosemide (LASIX) 40 MG tablet, Take 40 mg by mouth. Take 2 tablets every am, 1 tablet every pm, Disp: , Rfl:    gabapentin (NEURONTIN) 300 MG capsule, Take 300 mg by mouth 4 (four) times daily. , Disp: , Rfl:    HYDROcodone-acetaminophen (NORCO/VICODIN) 5-325 MG per tablet, Take 1 tablet by mouth every 6 (six) hours as needed for moderate pain. 1-2 every 6 hours as needed, Disp: , Rfl:    levothyroxine (SYNTHROID) 50 MCG tablet, Take 50 mcg by mouth daily., Disp: , Rfl:    olmesartan (BENICAR) 40 MG tablet, Take 40 mg by mouth daily., Disp: , Rfl:   Social History   Tobacco Use  Smoking Status Former  Smokeless Tobacco Never    Allergies  Allergen Reactions   Demerol [Meperidine]     hallucinations   Objective:  There were no vitals filed for this visit. There is no height or weight on file to calculate BMI. Constitutional Well developed. Well nourished.  Vascular Dorsalis pedis pulses palpable bilaterally. Posterior tibial pulses palpable bilaterally. Capillary refill normal to all digits.  No cyanosis or clubbing noted. Pedal hair growth normal.  Neurologic Normal speech. Oriented to person, place, and time. Epicritic sensation to light touch grossly present bilaterally.  Protective  sensation is intact.  Subjective complaint numbness tingling noted.  Vibratory sensation intact  Dermatologic Nails well groomed and normal in appearance. No open wounds. No skin lesions.  Orthopedic: Manual muscle testing 5-5.  No foot deformity noted.   Radiographs: None Assessment:   1. Neuropathic pain   2. Numbness and tingling    Plan:  Patient was evaluated and treated and all questions answered.  Neuropathic pain with underlying numbness and tingling secondary to unknown etiology -I explained the patient the etiology of neuropathic pain and various treatment options were extensively discussed.  Given the amount of neuropathy pain in the setting of being on gabapentin I  believe patient may benefit from topical application.  I discussed with her she can benefit from [facility] neuropathic cream to be applied twice a day. -I have sent the cream to specialty pharmacy and they will reach out to her to give her the cream.  Patient states understanding.  No follow-ups on file.

## 2020-11-13 ENCOUNTER — Ambulatory Visit (HOSPITAL_BASED_OUTPATIENT_CLINIC_OR_DEPARTMENT_OTHER): Payer: Medicare Other | Admitting: Cardiology

## 2020-11-13 ENCOUNTER — Other Ambulatory Visit: Payer: Self-pay

## 2020-11-13 ENCOUNTER — Encounter (HOSPITAL_BASED_OUTPATIENT_CLINIC_OR_DEPARTMENT_OTHER): Payer: Self-pay | Admitting: Cardiology

## 2020-11-13 VITALS — BP 138/66 | HR 68 | Ht 62.0 in | Wt 191.0 lb

## 2020-11-13 DIAGNOSIS — I10 Essential (primary) hypertension: Secondary | ICD-10-CM

## 2020-11-13 DIAGNOSIS — Z951 Presence of aortocoronary bypass graft: Secondary | ICD-10-CM | POA: Diagnosis not present

## 2020-11-13 DIAGNOSIS — I2510 Atherosclerotic heart disease of native coronary artery without angina pectoris: Secondary | ICD-10-CM | POA: Diagnosis not present

## 2020-11-13 DIAGNOSIS — I447 Left bundle-branch block, unspecified: Secondary | ICD-10-CM | POA: Diagnosis not present

## 2020-11-13 DIAGNOSIS — E785 Hyperlipidemia, unspecified: Secondary | ICD-10-CM

## 2020-11-13 DIAGNOSIS — I255 Ischemic cardiomyopathy: Secondary | ICD-10-CM

## 2020-11-13 NOTE — Patient Instructions (Signed)
Here is the number for the orthopedics dept at [place]: [phone].  Medication Instructions:  No change *If you need a refill on your cardiac medications before your next appointment, please call your pharmacy*   Lab Work: none If you have labs (blood work) drawn today and your tests are completely normal, you will receive your results only by: [place] (if you have MyChart) OR A paper copy in the mail If you have any lab test that is abnormal or we need to change your treatment, we will call you to review the results.   Testing/Procedures: none   Follow-Up: At [facility], you and your health needs are our priority.  As part of our continuing mission to provide you with exceptional heart care, we have created designated Provider Care Teams.  These Care Teams include your primary Cardiologist (physician) and Advanced Practice Providers (APPs -  Physician Assistants and Nurse Practitioners) who all work together to provide you with the care you need, when you need it.  We recommend signing up for the patient portal called "MyChart".  Sign up information is provided on this After Visit Summary.  MyChart is used to connect with patients for Virtual Visits (Telemedicine).  Patients are able to view lab/test results, encounter notes, upcoming appointments, etc.  Non-urgent messages can be sent to your provider as well.   To learn more about what you can do with MyChart, go to [email or web address].    Your next appointment:   6 month(s)  The format for your next appointment:   In Person  Provider:   Buford Dresser, MD   Other Instructions

## 2020-11-13 NOTE — Progress Notes (Signed)
Cardiology Office Note:    Date:  11/13/2020   ID:  Kristin Allen, DOB 1945-09-05, MRN [id number]  PCP:  Kristin Solian, MD  Cardiologist:  Buford Dresser, MD PhD (prior Dr. Wynonia Allen)  Referring MD: Kristin Solian, MD   CC: follow up  History of Present Illness:    Kristin Allen is a 75 y.o. female with a hx of CAD, hypertension, hyperlipidemia. She was previously followed by Dr. Wynonia Allen. Has a history of CAD, first MI at age 43 (in 70) and then CABG in 1998. Had cath with Dr. Wynonia Allen in 2011, had PCI to SVG at that time. Her anginal equivalent is left arm pain. Only her father had history of MI.  Today: Overall she is not feeling well, and is constantly feeling fatigued. Earlier this month she lost her husband after 22 weeks in the hospital. A week before she also lost her best friend.    Generally, she endorses significant back and bilateral foot pain.  Currently she is taking 1/2 of her amlodipine dosage. She develops some bilateral LE edema in her feet if she takes the full dose.  She denies any palpitations, chest pain, or shortness of breath. No lightheadedness, headaches, syncope, orthopnea, or PND. Also has no exertional symptoms.   Past Medical History:  Diagnosis Date   Anxiety    CAD (coronary artery disease)    positive test in 2011 with one stent placed in Dr Kristin Allen and colleagues   Glucose intolerance (malabsorption)    Heart disease    HTN (hypertension)    Hypothyroidism    Peripheral neuropathy    followed by Dr Kristin Allen every 5 years    Past Surgical History:  Procedure Laterality Date   [place]   by pass   [place]   stint  2010   TOTAL ABDOMINAL HYSTERECTOMY W/ BILATERAL SALPINGOOPHORECTOMY  1979    Current Medications: Current Outpatient Medications on File Prior to Visit  Medication Sig   Acetaminophen (TYLENOL EXTRA STRENGTH PO) Take by mouth. 500-'25mg'$ , take one tablet  qhs prn   ALPRAZolam (XANAX) 0.5 MG tablet Take 0.5 mg by mouth 2 (two) times daily.    amLODipine (NORVASC) 5 MG tablet Take 2.5 mg by mouth daily.   aspirin EC 81 MG tablet Take 81 mg by mouth daily.   atorvastatin (LIPITOR) 40 MG tablet Take 40 mg by mouth daily.   Calcium Carbonate-Vitamin D (CALTRATE 600+D PO) Take by mouth 2 (two) times daily.   carvedilol (COREG) 12.5 MG tablet TAKE 1 TABLET(12.5 MG) BY MOUTH TWICE DAILY   escitalopram (LEXAPRO) 5 MG tablet Take 5 mg by mouth daily.   FLUZONE HIGH-DOSE 0.5 ML SUSY    furosemide (LASIX) 40 MG tablet Take 40 mg by mouth. Take 2 tablets every am, 1 tablet every pm   gabapentin (NEURONTIN) 300 MG capsule Take 300 mg by mouth 4 (four) times daily.    HYDROcodone-acetaminophen (NORCO/VICODIN) 5-325 MG per tablet Take 1 tablet by mouth every 6 (six) hours as needed for moderate pain. 1-2 every 6 hours as needed   levothyroxine (SYNTHROID) 50 MCG tablet Take 50 mcg by mouth daily.   olmesartan (BENICAR) 40 MG tablet Take 40 mg by mouth daily.   No current facility-administered medications on file prior to visit.     Allergies:   Demerol [meperidine]   Social History   Tobacco Use   Smoking status: Former  Smokeless tobacco: Never  Substance Use Topics   Alcohol use: Yes    Alcohol/week: 0.0 standard drinks    Comment: wine   Drug use: No    Family History: The patient's family history includes Aneurysm in her sister; Anxiety disorder in her sister; CAD in her father; CVA in her father; Diabetes Mellitus I in her sister; Heart attack in her father; Heart disease in her father; Hypertension in her father.  ROS:   Please see the history of present illness.   (+) Fatigue (+) Back pain (+) Bilateral foot pain Additional pertinent ROS otherwise unremarkable.  EKGs/Labs/Other Studies Reviewed:    The following studies were reviewed today:  [place] LE Venous 07/20/2018: Summary:  Left: There is no evidence of deep vein thrombosis in  the lower  extremity.There is no evidence of superficial venous thrombosis.  US Aorta 08/03/2017: FINDINGS: Abdominal aortic measurements as follows:   Proximal:  2.5 cm Mid:  3.2 cm in transverse dimension Distal:  2.0 cm   IMPRESSION: Mild ectasia of the infrarenal aorta. Recommend followup by ultrasound in 3 years. This recommendation follows ACR consensus guidelines: White Paper of the ACR Incidental Findings Committee II on Vascular Findings. Joellyn Rued Radiol 2013; [phone]  Cath 05/24/2009 LV gram with EF 45%.  [place]: LM: 60-70% ostial LCx: occluded LAD: occluded after diagonal RCA: proximally occluded Grafts: SVG-RCA-PDA: widely patent SVG-OM: 99% proximal graft stenosis, PCI to this lesion LIMA-LAD-Diag: widely patent  Lexiscan 02/28/2009 Mild breast attenuation Moderately severe fixed defect in anteroseptal region consistent with prior MI No ischemia  Echo 02/20/2009 LV EF 40-45% Mild cLVH Hypokinesis of anterior, apex, lateral, septal walls No hemodynamically significant valve disease (Mild MR, trace TR)  EKG:    11/13/2020: NSR at 68. LBBB. 08/07/2020: EKG is not ordered today. 11/14/19: sinus bradycardia with PVC, LBBB, rate 54 bpm  Recent Labs: No results found for requested labs within last 8760 hours.  Recent Lipid Panel No results found for: CHOL, TRIG, HDL, CHOLHDL, VLDL, LDLCALC, LDLDIRECT  Physical Exam:    VS:  BP 138/66 (BP Location: Right Arm, Patient Position: Sitting, Cuff Size: Large)   Pulse 68   Ht '5\' 2"'$  (1.575 m)   Wt 191 lb (86.6 kg)   BMI 34.93 kg/m     Wt Readings from Last 3 Encounters:  11/13/20 191 lb (86.6 kg)  08/07/20 189 lb 9.6 oz (86 kg)  01/16/20 190 lb 6.4 oz (86.4 kg)   GEN: Well nourished, well developed in no acute distress HEENT: Normal, moist mucous membranes NECK: No JVD CARDIAC: regular rhythm, normal S1 and S2, no rubs or gallops. No murmur. VASCULAR: Radial and DP pulses 2+ bilaterally. No carotid  bruits RESPIRATORY:  Clear to auscultation without rales, wheezing or rhonchi  ABDOMEN: Soft, non-tender, non-distended MUSCULOSKELETAL:  Ambulates independently SKIN: Warm and dry, no edema NEUROLOGIC:  Alert and oriented x 3. No focal neuro deficits noted. PSYCHIATRIC:  Normal affect    ASSESSMENT:    1. Essential hypertension   2. Coronary artery disease involving native heart without angina pectoris, unspecified vessel or lesion type   3. Hx of CABG   4. LBBB (left bundle branch block)   5. Ischemic cardiomyopathy   6. Hyperlipidemia LDL goal <70     PLAN:    LE edema:  -much improved, though does not tolerate higher amlodipine dose -continue furosemide -discussed compression, elevation, salt avoidance  CAD s/p CABG Ischemic cardiomyopathy, EF 40-45% -on aspirin -on high intensity statin -on  beta blocker and ARB -denies angina  Hyperlipidemia:  -per [facility], lipids 04/17/20: Tchol 122, HDL 47, LDL 55, TG 100 -LDL goal <70, at goal  -continue atorvastatin 40 mg.  Hypertension, with reported hypertensive heart disease per notes:  -she is under severe stress with the loss of her husband, who was very ill and in the hospital for 22 weeks before he passed. -she will check at home, but we will not make changes given her significant stress recently -continue furosemide 80 mg in AM and 40 mg in PM -continue amlodipine 2.5 mg daily -continue carvedilol 12.5 mg BID -continue olmesartan 40 mg daily -given CAD, consider spironolactone in the future if potassium/renal function allows  LBBB: not new  Secondary prevention recommendations: -recommend heart healthy/Mediterranean diet, with whole grains, fruits, vegetable, fish, lean meats, nuts, and olive oil. Limit salt. -recommend moderate walking, 3-5 times/week for 30-50 minutes each session. Aim for at least 150 minutes.week. Goal should be pace of 3 miles/hours, or walking 1.5 miles in 30 minutes -recommend avoidance of tobacco  products. Avoid excess alcohol. -Additional risk factor control:  -Diabetes: No formal diagnosis, A1c is 5.4 per [facility]  -Weight: BMI 34, counseled on lifestyle/ideal weight  Plan for follow up: 6 mos or sooner as needed.  Medication Adjustments/Labs and Tests Ordered: Current medicines are reviewed at length with the patient today.  Concerns regarding medicines are outlined above.   Orders Placed This Encounter  Procedures   EKG 12-Lead    No orders of the defined types were placed in this encounter.  Patient Instructions  Here is the number for the orthopedics dept at [place]: [phone].  Medication Instructions:  No change *If you need a refill on your cardiac medications before your next appointment, please call your pharmacy*   Lab Work: none If you have labs (blood work) drawn today and your tests are completely normal, you will receive your results only by: [place] (if you have MyChart) OR A paper copy in the mail If you have any lab test that is abnormal or we need to change your treatment, we will call you to review the results.   Testing/Procedures: none   Follow-Up: At [facility], you and your health needs are our priority.  As part of our continuing mission to provide you with exceptional heart care, we have created designated Provider Care Teams.  These Care Teams include your primary Cardiologist (physician) and Advanced Practice Providers (APPs -  Physician Assistants and Nurse Practitioners) who all work together to provide you with the care you need, when you need it.  We recommend signing up for the patient portal called "MyChart".  Sign up information is provided on this After Visit Summary.  MyChart is used to connect with patients for Virtual Visits (Telemedicine).  Patients are able to view lab/test results, encounter notes, upcoming appointments, etc.  Non-urgent messages can be sent to your provider as well.   To learn more about what  you can do with MyChart, go to [email or web address].    Your next appointment:   6 month(s)  The format for your next appointment:   In Person  Provider:   Buford Dresser, MD   Other Instructions     I,Mathew Stumpf,acting as a scribe for Buford Dresser, MD.,have documented all relevant documentation on the behalf of Buford Dresser, MD,as directed by  Buford Dresser, MD while in the presence of Buford Dresser, MD.  I, Buford Dresser, MD, have reviewed all documentation for this visit. The  documentation on 11/13/20 for the exam, diagnosis, procedures, and orders are all accurate and complete.   Signed, Buford Dresser, MD PhD 11/13/2020  [place]

## 2020-12-24 ENCOUNTER — Other Ambulatory Visit: Payer: Self-pay | Admitting: Internal Medicine

## 2021-01-06 ENCOUNTER — Other Ambulatory Visit: Payer: Self-pay | Admitting: Medical

## 2021-05-15 NOTE — Progress Notes (Signed)
?Cardiology Office Note:   ? ?Date:  05/16/2021  ? ?ID:  [name], DOB 10/03/1945, MRN [id number] ? ?PCP:  Prince Solian, MD  ?Cardiologist:  Buford Dresser, MD PhD (prior Dr. Wynonia Lawman) ? ?Referring MD: Prince Solian, MD  ? ?CC: follow up ? ?History of Present Illness:   ? ?Kristin Allen is a 76 y.o. female with a hx of CAD, hypertension, hyperlipidemia. She was previously followed by Dr. Wynonia Lawman. Has a history of CAD, first MI at age 50 (in 45) and then CABG in 1998. Had cath with Dr. Wynonia Lawman in 2011, had PCI to SVG at that time. Her anginal equivalent is left arm pain. Only her father had history of MI. ? ?Today: ?Overall, she is feeling okay. Generally she is not concerned about her heart. ? ?About 2 months ago, she injured her right shoulder while reaching for her purse which she notes was heavy. Certain motions are difficult and painful such as when changing clothes. ? ?She continues to have issues with her feet and back pain. She would like to be more active, but notes that most activities she likes requires her to stand often. ? ?Lately she is taking 1/2 tablet of amlodipine due to swelling. ? ?She denies any palpitations, chest pain, or shortness of breath. No lightheadedness, headaches, syncope, orthopnea, or PND. ? ?She follows up with her PCP every 6 months. They are pursuing a possible spinal stimulator. Her last steriodal injection helped her for about 4 days. ? ? ?Past Medical History:  ?Diagnosis Date  ? Anxiety   ? CAD (coronary artery disease)   ? positive test in 2011 with one stent placed in Dr Wynonia Lawman and colleagues  ? Glucose intolerance (malabsorption)   ? Heart disease   ? HTN (hypertension)   ? Hypothyroidism   ? Peripheral neuropathy   ? followed by Dr Erling Cruz every 5 years  ? ? ?Past Surgical History:  ?Procedure Laterality Date  ? CARDIAC SURGERY  1986  ? by pass  ? CHOLECYSTECTOMY  1979  ? CORONARY ARTERY BYPASS GRAFT  1999  ? stint  2010  ? TOTAL ABDOMINAL HYSTERECTOMY W/  BILATERAL SALPINGOOPHORECTOMY  1979  ? ? ?Current Medications: ?Current Outpatient Medications on File Prior to Visit  ?Medication Sig  ? Acetaminophen (TYLENOL EXTRA STRENGTH PO) Take by mouth. 500-'25mg'$ , take one tablet qhs prn  ? ALPRAZolam (XANAX) 0.5 MG tablet Take 0.5 mg by mouth 2 (two) times daily.   ? amLODipine (NORVASC) 5 MG tablet TAKE 1 TABLET(5 MG) BY MOUTH DAILY  ? aspirin EC 81 MG tablet Take 81 mg by mouth daily.  ? atorvastatin (LIPITOR) 40 MG tablet Take 40 mg by mouth daily.  ? Calcium Carbonate-Vitamin D (CALTRATE 600+D PO) Take by mouth 2 (two) times daily.  ? carvedilol (COREG) 12.5 MG tablet Take 1 tablet (12.5 mg total) by mouth in the morning and at bedtime. Please keep upcoming appointment in Jan. 2023  ? escitalopram (LEXAPRO) 5 MG tablet Take 5 mg by mouth daily.  ? FLUZONE HIGH-DOSE 0.5 ML SUSY   ? furosemide (LASIX) 40 MG tablet Take 40 mg by mouth. Take 2 tablets every am, 1 tablet every pm  ? gabapentin (NEURONTIN) 300 MG capsule Take 300 mg by mouth 4 (four) times daily.   ? HYDROcodone-acetaminophen (NORCO/VICODIN) 5-325 MG per tablet Take 1 tablet by mouth every 6 (six) hours as needed for moderate pain. 1-2 every 6 hours as needed  ? levothyroxine (SYNTHROID) 50 MCG tablet Take  50 mcg by mouth daily.  ? olmesartan (BENICAR) 40 MG tablet Take 40 mg by mouth daily.  ? ?No current facility-administered medications on file prior to visit.  ?  ? ?Allergies:   Demerol [meperidine]  ? ?Social History  ? ?Tobacco Use  ? Smoking status: Former  ? Smokeless tobacco: Never  ?Substance Use Topics  ? Alcohol use: Yes  ?  Alcohol/week: 0.0 standard drinks  ?  Comment: wine  ? Drug use: No  ? ? ?Family History: ?The patient's family history includes Aneurysm in her sister; Anxiety disorder in her sister; CAD in her father; CVA in her father; Diabetes Mellitus I in her sister; Heart attack in her father; Heart disease in her father; Hypertension in her father. ? ?ROS:   ?Please see the history of  present illness.   ?(+) Right shoulder pain ?(+) Back pain ?(+) Stress ?Additional pertinent ROS otherwise unremarkable. ? ?EKGs/Labs/Other Studies Reviewed:   ? ?The following studies were reviewed today: ? ?[place] LE Venous 07/20/2018: ?Summary:  ?Left: There is no evidence of deep vein thrombosis in the lower  ?extremity.There is no evidence of superficial venous thrombosis. ? ?US Aorta 08/03/2017: ?FINDINGS: ?Abdominal aortic measurements as follows: ?  ?Proximal:  2.5 cm ?Mid:  3.2 cm in transverse dimension ?Distal:  2.0 cm ?  ?IMPRESSION: ?Mild ectasia of the infrarenal aorta. Recommend followup by ?ultrasound in 3 years. This recommendation follows ACR consensus ?guidelines: White Paper of the ACR Incidental Findings Committee II ?on Vascular Findings. Natasha [facility] Coll Radiol 2013; 10:789-794 ? ?Cath 05/24/2009 ?LV gram with EF 45%.  ?Natives: ?LM: 60-70% ostial ?LCx: occluded ?LAD: occluded after diagonal ?RCA: proximally occluded ?Grafts: ?SVG-RCA-PDA: widely patent ?SVG-OM: 99% proximal graft stenosis, PCI to this lesion ?LIMA-LAD-Diag: widely patent ? ?Lexiscan 02/28/2009 ?Mild breast attenuation ?Moderately severe fixed defect in anteroseptal region consistent with prior MI ?No ischemia ? ?Echo 02/20/2009 ?LV EF 40-45% ?Mild cLVH ?Hypokinesis of anterior, apex, lateral, septal walls ?No hemodynamically significant valve disease (Mild MR, trace TR) ? ?EKG:   EKG is personally reviewed. ?05/16/2021: not ordered today ?11/13/2020: NSR at 68. LBBB. ?08/07/2020: EKG is not ordered today. ?11/14/19: sinus bradycardia with PVC, LBBB, rate 54 bpm ? ?Recent Labs: ?No results found for requested labs within last 8760 hours.  ? ?Recent Lipid Panel ?No results found for: CHOL, TRIG, HDL, CHOLHDL, VLDL, LDLCALC, LDLDIRECT ? ?Physical Exam:   ? ?VS:  BP 126/62   Pulse 61   Ht 5' (1.524 m)   Wt 203 lb (92.1 kg)   SpO2 96%   BMI 39.65 kg/m?    ? ?Wt Readings from Last 3 Encounters:  ?05/16/21 203 lb (92.1 kg)  ?11/13/20 191 lb (86.6  kg)  ?08/07/20 189 lb 9.6 oz (86 kg)  ? ?GEN: Well nourished, well developed in no acute distress ?HEENT: Normal, moist mucous membranes ?NECK: No JVD ?CARDIAC: regular rhythm, normal S1 and S2, no rubs or gallops. No murmur. ?VASCULAR: Radial and DP pulses 2+ bilaterally. No carotid bruits ?RESPIRATORY:  Clear to auscultation without rales, wheezing or rhonchi  ?ABDOMEN: Soft, non-tender, non-distended ?MUSCULOSKELETAL:  Ambulates independently ?SKIN: Warm and dry, trivial bilateral LE edema ?NEUROLOGIC:  Alert and oriented x 3. No focal neuro deficits noted. ?PSYCHIATRIC:  Normal affect   ? ?ASSESSMENT:   ? ?1. Essential hypertension   ?2. Coronary artery disease involving native heart without angina pectoris, unspecified vessel or lesion type   ?3. LBBB (left bundle branch block)   ?4. Hx of CABG   ?  5. Ischemic cardiomyopathy   ?6. Chronic combined systolic and diastolic CHF (congestive heart failure) ([place])   ?7. Pure hypercholesterolemia   ? ? ?PLAN:   ? ?LE edema:  ?-much improved, though does not tolerate higher amlodipine dose. Ok to continue 2.5 mg dose amlodipine as BP well controlled. Will order at this dose ?-continue furosemide ?-discussed compression, elevation, salt avoidance ? ?CAD s/p CABG ?Ischemic cardiomyopathy, EF 40-45% ?-on aspirin ?-on high intensity statin ?-on beta blocker and ARB ?-denies angina ? ?Hyperlipidemia:  ?-per [facility], lipids 04/17/20: Tchol 122, HDL 47, LDL 55, TG 100 ?-LDL goal <70, at goal  ?-continue atorvastatin 40 mg. ? ?Hypertension, with reported hypertensive heart disease per notes:  ?-continue furosemide 80 mg in AM and 40 mg in PM ?-continue amlodipine 2.5 mg daily ?-continue carvedilol 12.5 mg BID ?-continue olmesartan 40 mg daily ?-given CAD, consider spironolactone in the future if potassium/renal function allows ? ?LBBB: not new ? ?Secondary prevention recommendations: ?-recommend heart healthy/Mediterranean diet, with whole grains, fruits, vegetable, fish, lean meats,  nuts, and olive oil. Limit salt. ?-recommend moderate walking, 3-5 times/week for 30-50 minutes each session. Aim for at least 150 minutes.week. Goal should be pace of 3 miles/hours, or walking 1.5 miles

## 2021-05-16 ENCOUNTER — Encounter (HOSPITAL_BASED_OUTPATIENT_CLINIC_OR_DEPARTMENT_OTHER): Payer: Self-pay | Admitting: Cardiology

## 2021-05-16 ENCOUNTER — Ambulatory Visit (HOSPITAL_BASED_OUTPATIENT_CLINIC_OR_DEPARTMENT_OTHER): Payer: Medicare Other | Admitting: Cardiology

## 2021-05-16 ENCOUNTER — Other Ambulatory Visit: Payer: Self-pay

## 2021-05-16 VITALS — BP 126/62 | HR 61 | Ht 60.0 in | Wt 203.0 lb

## 2021-05-16 DIAGNOSIS — I2510 Atherosclerotic heart disease of native coronary artery without angina pectoris: Secondary | ICD-10-CM

## 2021-05-16 DIAGNOSIS — I447 Left bundle-branch block, unspecified: Secondary | ICD-10-CM

## 2021-05-16 DIAGNOSIS — I10 Essential (primary) hypertension: Secondary | ICD-10-CM

## 2021-05-16 DIAGNOSIS — Z951 Presence of aortocoronary bypass graft: Secondary | ICD-10-CM

## 2021-05-16 DIAGNOSIS — I255 Ischemic cardiomyopathy: Secondary | ICD-10-CM

## 2021-05-16 DIAGNOSIS — I5042 Chronic combined systolic (congestive) and diastolic (congestive) heart failure: Secondary | ICD-10-CM

## 2021-05-16 DIAGNOSIS — E7800 Pure hypercholesterolemia, unspecified: Secondary | ICD-10-CM

## 2021-05-16 MED ORDER — AMLODIPINE BESYLATE 2.5 MG PO TABS: 2.5000 mg | ORAL_TABLET | Freq: Every day | ORAL | 3 refills | Status: DC

## 2021-05-16 NOTE — Patient Instructions (Signed)
Medication Instructions:  ?Your physician recommends that you continue on your current medications as directed. Please refer to the Current Medication list given to you today.  ? ?*If you need a refill on your cardiac medications before your next appointment, please call your pharmacy* ? ? ?Lab Work: ?NONE ?If you have labs (blood work) drawn today and your tests are completely normal, you will receive your results only by: ?MyChart Message (if you have MyChart) OR ?A paper copy in the mail ?If you have any lab test that is abnormal or we need to change your treatment, we will call you to review the results. ? ? ?Testing/Procedures: ?NONE ? ? ?Follow-Up: ?At [facility], you and your health needs are our priority.  As part of our continuing mission to provide you with exceptional heart care, we have created designated Provider Care Teams.  These Care Teams include your primary Cardiologist (physician) and Advanced Practice Providers (APPs -  Physician Assistants and Nurse Practitioners) who all work together to provide you with the care you need, when you need it. ? ?We recommend signing up for the patient portal called "MyChart".  Sign up information is provided on this After Visit Summary.  MyChart is used to connect with patients for Virtual Visits (Telemedicine).  Patients are able to view lab/test results, encounter notes, upcoming appointments, etc.  Non-urgent messages can be sent to your provider as well.   ?To learn more about what you can do with MyChart, go to [email or web address].   ? ?Your next appointment:   ?9 month(s) ? ?The format for your next appointment:   ?In Person ? ?Provider:   ?DR Harrell Gave  ?

## 2021-06-16 ENCOUNTER — Encounter (HOSPITAL_BASED_OUTPATIENT_CLINIC_OR_DEPARTMENT_OTHER): Payer: Self-pay | Admitting: Cardiology

## 2021-07-14 ENCOUNTER — Other Ambulatory Visit: Payer: Self-pay | Admitting: Cardiology

## 2021-07-14 NOTE — Telephone Encounter (Signed)
Rx(s) sent to pharmacy electronically.  

## 2021-12-12 ENCOUNTER — Telehealth: Payer: Self-pay | Admitting: *Deleted

## 2021-12-12 NOTE — Telephone Encounter (Signed)
   Pre-operative Risk Assessment    Patient Name: Kristin Allen  DOB: 08/07/1945 MRN: [id number]      Request for Surgical Clearance    Procedure:   NEED PROCEDURE  Date of Surgery:  Clearance TBD                                 Surgeon:  DR. Esmond Plants Surgeon's Group or Practice Name:  [facility] Phone number:  [phone] Fax number:  [phone] ATTN: MEGAN DAVIS   Type of Clearance Requested:   - Medical ; ASA    Type of Anesthesia:   CHOICE   Additional requests/questions:    Jiles Prows   12/12/2021, 5:49 PM

## 2021-12-15 NOTE — Telephone Encounter (Signed)
ADDENDUM:  PROCEDURE: RIGHT REVERSE TSA

## 2021-12-15 NOTE — Telephone Encounter (Signed)
   Name: Kristin Allen  DOB: 08-14-1945  MRN: [id number]  Primary Cardiologist: Buford Dresser, MD   Preoperative team, please contact this patient and set up a phone call appointment for further preoperative risk assessment. Please obtain consent and complete medication review. Thank you for your help.  I confirm that guidance regarding antiplatelet and oral anticoagulation therapy has been completed and, if necessary, noted below.  Per office protocol, if patient is without any new symptoms or concerns at the time of their virtual visit, he/she may hold Aspirin for 5-7 days prior to procedure. Please resume Aspirin as soon as possible postprocedure, at the discretion of the surgeon.    Lenna Sciara, NP 12/15/2021, 5:07 PM [place]

## 2021-12-16 ENCOUNTER — Telehealth: Payer: Self-pay | Admitting: *Deleted

## 2021-12-16 NOTE — Telephone Encounter (Signed)
I s/w the pt about needing a tele pre op appt for pre op clearance. Pt began questioning me and as to why this was necessary. I tried to explain to the pt that there is a pre op protocol the cardiologist follow. Pt then began to say she told the surgeon that getting the clearance from her PCP Dr. Dagmar Hait would be no problem. Pt then began making many negative comments about our practice. I did ask the pt to please not make comments about our practice that we work hard to care for pt's . Pt tells me that she was told there were a couple of surgery date available between Now and Dec, but then states to me, "because of you people I won't have surgery until July". I asked the pt why is making comments like this and that I am only trying to help her and that I am not her enemy. Pt finally agreed to a tele appt 01/01/22 @ 2 pm. Though pt kept up with her comments and negativity about our practice. I then confirmed her appt and # for [place] to call and ended the call.   I did not review the pt meds with her during this call and pt was not being cooperative and was very argumentative.  Consent done.

## 2021-12-29 ENCOUNTER — Ambulatory Visit: Payer: Medicare Other | Admitting: Podiatry

## 2021-12-29 ENCOUNTER — Encounter: Payer: Self-pay | Admitting: Podiatry

## 2021-12-29 DIAGNOSIS — M79674 Pain in right toe(s): Secondary | ICD-10-CM

## 2021-12-29 DIAGNOSIS — M79675 Pain in left toe(s): Secondary | ICD-10-CM

## 2021-12-29 DIAGNOSIS — B351 Tinea unguium: Secondary | ICD-10-CM

## 2021-12-30 NOTE — Progress Notes (Signed)
Subjective:   Patient ID: Kristin Allen, female   DOB: 76 y.o.   MRN: [id number]   HPI Patient presents with elongated nailbeds 1-5 both feet that are hard for her to cut.  States they both are bothersome and the right second nail is the worst one but she has overall nails that can become irritated   ROS      Objective:  Physical Exam  Thick yellow brittle nailbeds 1-5 both feet second nail right found to be very thickened that are dystrophic moderately painful when pressed     Assessment:  Mycotic nail infection with pain 1-5 both feet     Plan:  Debridement of nailbeds 1-5 both feet no iatrogenic bleeding patient to be seen back doing well and I do think she can get pedicures from the proper person I gave her names of people I feel could do a good job for her

## 2022-01-02 ENCOUNTER — Ambulatory Visit: Payer: Medicare Other | Attending: Cardiovascular Disease | Admitting: Nurse Practitioner

## 2022-01-02 DIAGNOSIS — Z01810 Encounter for preprocedural cardiovascular examination: Secondary | ICD-10-CM

## 2022-01-02 NOTE — Progress Notes (Signed)
Virtual Visit via Telephone Note   Because of Kristin Allen's co-morbid illnesses, she is at least at moderate risk for complications without adequate follow up.  This format is felt to be most appropriate for this patient at this time.  The patient did not have access to video technology/had technical difficulties with video requiring transitioning to audio format only (telephone).  All issues noted in this document were discussed and addressed.  No physical exam could be performed with this format.  Please refer to the patient's chart for her consent to telehealth for [facility].  Evaluation Performed:  Preoperative cardiovascular risk assessment _____________   Date:  01/02/2022   Patient ID:  Kristin Allen, DOB 1946-02-02, MRN [id number] Patient Location:  Home Provider location:   Office  Primary Care Provider:  Prince Solian, MD Primary Cardiologist:  Buford Dresser, MD  Chief Complaint / Patient Profile   76 y.o. y/o female with a h/o CAD s/p CABG, ICM (EF 40 to 45%), bilateral lower extremity edema, hypertension, hyperlipidemia, and LBBB who is pending right reverse total shoulder arthroplasty with Dr. Earlie Counts of [facility] and presents today for telephonic preoperative cardiovascular risk assessment.  Past Medical History    Past Medical History:  Diagnosis Date   Anxiety    CAD (coronary artery disease)    positive test in 2011 with one stent placed in Dr Wynonia Lawman and colleagues   Glucose intolerance (malabsorption)    Heart disease    HTN (hypertension)    Hypothyroidism    Peripheral neuropathy    followed by Dr Erling Cruz every 5 years   Past Surgical History:  Procedure Laterality Date   [place]   by pass   CHOLECYSTECTOMY  1979   CORONARY ARTERY BYPASS GRAFT  1999   stint  2010   TOTAL ABDOMINAL HYSTERECTOMY W/ BILATERAL SALPINGOOPHORECTOMY  1979    Allergies  Allergies  Allergen Reactions   Demerol  [Meperidine]     hallucinations    History of Present Illness    Kristin Allen is a 76 y.o. female who presents via audio/video conferencing for a telehealth visit today.  Pt was last seen in cardiology clinic on 05/16/2021 by Dr. Harrell Gave.  At that time Kristin Allen was doing well. The patient is now pending procedure as outlined above. Since her last visit, she has been stable from a cardiac standpoint. She denies chest pain, palpitations, dyspnea, pnd, orthopnea, n, v, dizziness, syncope, edema, weight gain, or early satiety. All other systems reviewed and are otherwise negative except as noted above.   Home Medications    Prior to Admission medications   Medication Sig Start Date End Date Taking? Authorizing Provider  Acetaminophen (TYLENOL EXTRA STRENGTH PO) Take by mouth. 500-'25mg'$ , take one tablet qhs prn    [provider]  ALPRAZolam (XANAX) 0.5 MG tablet Take 0.5 mg by mouth 2 (two) times daily.     [provider]  amLODipine (NORVASC) 2.5 MG tablet Take 1 tablet (2.5 mg total) by mouth daily. 05/16/21   Buford Dresser, MD  aspirin EC 81 MG tablet Take 81 mg by mouth daily.    [provider]  atorvastatin (LIPITOR) 40 MG tablet Take 40 mg by mouth daily.    [provider]  Calcium Carbonate-Vitamin D (CALTRATE 600+D PO) Take by mouth 2 (two) times daily.    [provider]  carvedilol (COREG) 12.5 MG tablet TAKE 1 TABLET BY MOUTH EVERY MORNING  AND AT BEDTIME 07/14/21   Buford Dresser, MD  escitalopram (LEXAPRO) 5 MG tablet Take 5 mg by mouth daily.    [provider]  FLUZONE HIGH-DOSE 0.5 ML SUSY  12/07/13   [provider]  furosemide (LASIX) 40 MG tablet Take 40 mg by mouth. Take 2 tablets every am, 1 tablet every pm    [provider]  gabapentin (NEURONTIN) 300 MG capsule Take 300 mg by mouth 4 (four) times daily.     [provider]  HYDROcodone-acetaminophen (NORCO/VICODIN)  5-325 MG per tablet Take 1 tablet by mouth every 6 (six) hours as needed for moderate pain. 1-2 every 6 hours as needed    [provider]  levothyroxine (SYNTHROID) 50 MCG tablet Take 50 mcg by mouth daily.    [provider]  olmesartan (BENICAR) 40 MG tablet Take 40 mg by mouth daily.    [provider]    Physical Exam    Vital Signs:  Kristin Allen does not have vital signs available for review today.  Given telephonic nature of communication, physical exam is limited. AAOx3. NAD. Normal affect.  Speech and respirations are unlabored.  Accessory Clinical Findings    None  Assessment & Plan    1.  Preoperative Cardiovascular Risk Assessment:  According to the Revised Cardiac Risk Index (RCRI), her Perioperative Risk of Major Cardiac Event is (%): 0.9. Her Functional Capacity in METs is: 4.73 according to the Duke Activity Status Index (DASI). Therefore, based on ACC/AHA guidelines, patient would be at acceptable risk for the planned procedure without further cardiovascular testing.  The patient was advised that if she develops new symptoms prior to surgery to contact our office to arrange for a follow-up visit, and she verbalized understanding.  Patient states that she may not be able to have surgery until after the first of the year. If this is the case, patient should require additional clearance at that time, a telephone call to ensure patient has not had any new symptoms or concerns would be acceptable in order to provide clearance. Patient should not require another virtual visit.  Per office protocol, she may hold Aspirin for 5-7 days prior to procedure. Please resume Aspirin as soon as possible postprocedure, at the discretion of the surgeon.   A copy of this note will be routed to requesting surgeon.  Time:   Today, I have spent 6 minutes with the patient with telehealth technology discussing medical history, symptoms, and management plan.      Lenna Sciara, NP  01/02/2022, 2:11 PM

## 2022-02-20 ENCOUNTER — Encounter (HOSPITAL_BASED_OUTPATIENT_CLINIC_OR_DEPARTMENT_OTHER): Payer: Self-pay | Admitting: Cardiology

## 2022-02-20 ENCOUNTER — Ambulatory Visit (HOSPITAL_BASED_OUTPATIENT_CLINIC_OR_DEPARTMENT_OTHER): Payer: Medicare Other | Admitting: Cardiology

## 2022-02-20 VITALS — BP 100/60 | HR 57 | Ht 60.0 in | Wt 199.0 lb

## 2022-02-20 DIAGNOSIS — I10 Essential (primary) hypertension: Secondary | ICD-10-CM

## 2022-02-20 DIAGNOSIS — E7800 Pure hypercholesterolemia, unspecified: Secondary | ICD-10-CM

## 2022-02-20 DIAGNOSIS — I447 Left bundle-branch block, unspecified: Secondary | ICD-10-CM

## 2022-02-20 DIAGNOSIS — Z951 Presence of aortocoronary bypass graft: Secondary | ICD-10-CM | POA: Diagnosis not present

## 2022-02-20 DIAGNOSIS — I2510 Atherosclerotic heart disease of native coronary artery without angina pectoris: Secondary | ICD-10-CM | POA: Diagnosis not present

## 2022-02-20 DIAGNOSIS — I255 Ischemic cardiomyopathy: Secondary | ICD-10-CM

## 2022-02-20 NOTE — Progress Notes (Signed)
Cardiology Office Note:    Date:  02/20/2022   ID:  Kristin Allen, DOB Jan 31, 1946, MRN [id number]  PCP:  Prince Solian, MD  Cardiologist:  Buford Dresser, MD PhD (prior Dr. Wynonia Lawman)  Referring MD: Prince Solian, MD   CC: follow up  History of Present Illness:    Kristin Allen is a 76 y.o. female with a hx of CAD, hypertension, hyperlipidemia. She was previously followed by Dr. Wynonia Lawman. Has a history of CAD, first MI at age 70 (in 66) and then CABG in 1998. Had cath with Dr. Wynonia Lawman in 2011, had PCI to SVG at that time. Her anginal equivalent is left arm pain. Only her father had history of MI.  Patient was doing well from a CV perspective at her last visit with me on 05/16/21. She did complain of right shoulder pain s/p an injury while reaching for her heavy purse. She was evaluated by ortho and is now scheduled for right reverse total shoulder arthroplasty with Dr. Netta Cedars on 03/20/2022. She was seen via telehealth visit by NP Diona Browner for preop clearance.  Today, she states that she has been doing well from a CV perspective. She denies any new symptoms or changes in her health since she was evaluated by Diona Browner on 01/02/22.  She reports that she is able to climb a flight of stairs if needed without difficulty. Pt denies any chest pain or shortness of breath. She denies any near syncope, syncope, dizziness, or lightheadedness. She denies any leg swelling. She denies any PND or orthopnea. Pt denies any blood in her stools or hematuria.  She states that her blood pressure has been well controlled in the last year both in and out of the office.  BP Readings from Last 3 Encounters:  02/20/22 100/60  05/16/21 126/62  11/13/20 138/66   Following recovery from her shoulder surgery, she is considering back surgery with Dr. Sherley Bounds.  Past Medical History:  Diagnosis Date   Anxiety    CAD (coronary artery disease)    positive test in 2011 with one stent  placed in Dr Wynonia Lawman and colleagues   Glucose intolerance (malabsorption)    Heart disease    HTN (hypertension)    Hypothyroidism    Peripheral neuropathy    followed by Dr Erling Cruz every 5 years    Past Surgical History:  Procedure Laterality Date   [place]   by pass   [place]   stint  2010   TOTAL ABDOMINAL HYSTERECTOMY W/ BILATERAL SALPINGOOPHORECTOMY  1979    Current Medications: Current Outpatient Medications on File Prior to Visit  Medication Sig   Acetaminophen (TYLENOL EXTRA STRENGTH PO) Take by mouth. 500-'25mg'$ , take one tablet qhs prn   ALPRAZolam (XANAX) 0.5 MG tablet Take 0.5 mg by mouth 2 (two) times daily.    amLODipine (NORVASC) 2.5 MG tablet Take 1 tablet (2.5 mg total) by mouth daily.   aspirin EC 81 MG tablet Take 81 mg by mouth daily.   atorvastatin (LIPITOR) 40 MG tablet Take 40 mg by mouth daily.   Calcium Carbonate-Vitamin D (CALTRATE 600+D PO) Take by mouth 2 (two) times daily.   carvedilol (COREG) 12.5 MG tablet TAKE 1 TABLET BY MOUTH EVERY MORNING AND AT BEDTIME   escitalopram (LEXAPRO) 5 MG tablet Take 5 mg by mouth daily.   FLUZONE HIGH-DOSE 0.5 ML SUSY    furosemide (LASIX) 40 MG tablet Take  40 mg by mouth. Take 2 tablets every am, 1 tablet every pm   gabapentin (NEURONTIN) 300 MG capsule Take 300 mg by mouth 4 (four) times daily.    HYDROcodone-acetaminophen (NORCO/VICODIN) 5-325 MG per tablet Take 1 tablet by mouth every 6 (six) hours as needed for moderate pain. 1-2 every 6 hours as needed   levothyroxine (SYNTHROID) 50 MCG tablet Take 50 mcg by mouth daily.   olmesartan (BENICAR) 40 MG tablet Take 40 mg by mouth daily.   No current facility-administered medications on file prior to visit.     Allergies:   Demerol [meperidine]   Social History   Tobacco Use   Smoking status: Former   Smokeless tobacco: Never  Substance Use Topics   Alcohol use: Yes    Alcohol/week: 0.0 standard  drinks of alcohol    Comment: wine   Drug use: No    Family History: The patient's family history includes Aneurysm in her sister; Anxiety disorder in her sister; CAD in her father; CVA in her father; Diabetes Mellitus I in her sister; Heart attack in her father; Heart disease in her father; Hypertension in her father.  ROS:   Please see the history of present illness.   Additional pertinent ROS otherwise unremarkable.  EKGs/Labs/Other Studies Reviewed:    The following studies were reviewed today:  [place] LE Venous 07/20/2018: Summary:  Left: There is no evidence of deep vein thrombosis in the lower  extremity.There is no evidence of superficial venous thrombosis.  US Aorta 08/03/2017: FINDINGS: Abdominal aortic measurements as follows:   Proximal:  2.5 cm Mid:  3.2 cm in transverse dimension Distal:  2.0 cm   IMPRESSION: Mild ectasia of the infrarenal aorta. Recommend followup by ultrasound in 3 years. This recommendation follows ACR consensus guidelines: White Paper of the ACR Incidental Findings Committee II on Vascular Findings. Joellyn Rued Radiol 2013; [phone]  Cath 05/24/2009 LV gram with EF 45%.  Natives: LM: 60-70% ostial LCx: occluded LAD: occluded after diagonal RCA: proximally occluded Grafts: SVG-RCA-PDA: widely patent SVG-OM: 99% proximal graft stenosis, PCI to this lesion LIMA-LAD-Diag: widely patent  Lexiscan 02/28/2009 Mild breast attenuation Moderately severe fixed defect in anteroseptal region consistent with prior MI No ischemia  Echo 02/20/2009 LV EF 40-45% Mild cLVH Hypokinesis of anterior, apex, lateral, septal walls No hemodynamically significant valve disease (Mild MR, trace TR)  EKG:  EKG ordered today, 02/20/22.  The ekg ordered today demonstrates sinus bradycardia/sinus arrhythmia at 57 bpm, LBBB  11/13/2020: NSR at 68. LBBB. 11/14/19: sinus bradycardia with PVC, LBBB, rate 54 bpm  Recent Labs: No results found for requested labs  within last 365 days.   Recent Lipid Panel No results found for: "CHOL", "TRIG", "HDL", "CHOLHDL", "VLDL", "LDLCALC", "LDLDIRECT"  Physical Exam:    VS:  BP 100/60 (BP Location: Left Arm, Patient Position: Sitting, Cuff Size: Large)   Pulse (!) 57   Ht 5' (1.524 m)   Wt 199 lb (90.3 kg)   BMI 38.86 kg/m     Wt Readings from Last 3 Encounters:  02/20/22 199 lb (90.3 kg)  05/16/21 203 lb (92.1 kg)  11/13/20 191 lb (86.6 kg)   GEN: Well nourished, well developed in no acute distress HEENT: Normal, moist mucous membranes NECK: No JVD CARDIAC: regular rhythm, normal S1 and S2, no rubs or gallops. No murmur. VASCULAR: Radial and DP pulses 2+ bilaterally. No carotid bruits RESPIRATORY:  Clear to auscultation without rales, wheezing or rhonchi  ABDOMEN: Soft, non-tender, non-distended MUSCULOSKELETAL:  Ambulates independently SKIN: Warm and dry, no edema NEUROLOGIC:  Alert and oriented x 3. No focal neuro deficits noted. PSYCHIATRIC:  Normal affect    ASSESSMENT:    1. Essential hypertension   2. Coronary artery disease involving native heart without angina pectoris, unspecified vessel or lesion type   3. Hx of CABG   4. LBBB (left bundle branch block)   5. Ischemic cardiomyopathy   6. Pure hypercholesterolemia    PLAN:    LE edema:  -much improved, though does not tolerate higher amlodipine dose. Ok to continue 2.5 mg dose amlodipine as BP well controlled.  -continue furosemide -discussed compression, elevation, salt avoidance  CAD s/p CABG Ischemic cardiomyopathy, EF 40-45% -on aspirin -on high intensity statin -on beta blocker and ARB -denies angina -NYHA class 1  Hyperlipidemia:  -per [facility], lipids 05/12/21: Tchol 125, HDL 37, LDL 59, TG 145 -LDL goal <70, at goal  -continue atorvastatin 40 mg.  Hypertension, with reported hypertensive heart disease per notes:  -continue furosemide 80 mg in AM and 40 mg in PM -continue amlodipine 2.5 mg daily -continue  carvedilol 12.5 mg BID -continue olmesartan 40 mg daily -given CAD, consider spironolactone in the future if potassium/renal function allows  LBBB: not new  Secondary prevention recommendations: -recommend heart healthy/Mediterranean diet, with whole grains, fruits, vegetable, fish, lean meats, nuts, and olive oil. Limit salt. -recommend moderate walking, 3-5 times/week for 30-50 minutes each session. Aim for at least 150 minutes.week. Goal should be pace of 3 miles/hours, or walking 1.5 miles in 30 minutes -recommend avoidance of tobacco products. Avoid excess alcohol.  Plan for follow up: 6 months or sooner if needed.  Medication Adjustments/Labs and Tests Ordered: Current medicines are reviewed at length with the patient today.  Concerns regarding medicines are outlined above.   Orders Placed This Encounter  Procedures   EKG 12-Lead   No orders of the defined types were placed in this encounter.  Patient Instructions  Medication Instructions:  Your physician recommends that you continue on your current medications as directed. Please refer to the Current Medication list given to you today.   *If you need a refill on your cardiac medications before your next appointment, please call your pharmacy*  Lab Work: NONE  Testing/Procedures: NONE  Follow-Up: At [facility], you and your health needs are our priority.  As part of our continuing mission to provide you with exceptional heart care, we have created designated Provider Care Teams.  These Care Teams include your primary Cardiologist (physician) and Advanced Practice Providers (APPs -  Physician Assistants and Nurse Practitioners) who all work together to provide you with the care you need, when you need it.  We recommend signing up for the patient portal called "MyChart".  Sign up information is provided on this After Visit Summary.  MyChart is used to connect with patients for Virtual Visits (Telemedicine).   Patients are able to view lab/test results, encounter notes, upcoming appointments, etc.  Non-urgent messages can be sent to your provider as well.   To learn more about what you can do with MyChart, go to [email or web address].    Your next appointment:   6 month(s)  The format for your next appointment:   In Person  Provider:   Buford Dresser, MD   Important Information About [place] as a scribe for Buford Dresser, MD.,have documented all relevant documentation on the behalf of Buford Dresser, MD,as directed by  Buford Dresser, MD while in the presence of Buford Dresser, MD.  I, Buford Dresser, MD, have reviewed all documentation for this visit. The documentation on 03/03/22 for the exam, diagnosis, procedures, and orders are all accurate and complete.   Signed, Buford Dresser, MD PhD 02/20/2022  [place]

## 2022-02-20 NOTE — Patient Instructions (Signed)
Medication Instructions:  Your physician recommends that you continue on your current medications as directed. Please refer to the Current Medication list given to you today.   *If you need a refill on your cardiac medications before your next appointment, please call your pharmacy*  Lab Work: NONE  Testing/Procedures: NONE  Follow-Up: At [facility], you and your health needs are our priority.  As part of our continuing mission to provide you with exceptional heart care, we have created designated Provider Care Teams.  These Care Teams include your primary Cardiologist (physician) and Advanced Practice Providers (APPs -  Physician Assistants and Nurse Practitioners) who all work together to provide you with the care you need, when you need it.  We recommend signing up for the patient portal called "MyChart".  Sign up information is provided on this After Visit Summary.  MyChart is used to connect with patients for Virtual Visits (Telemedicine).  Patients are able to view lab/test results, encounter notes, upcoming appointments, etc.  Non-urgent messages can be sent to your provider as well.   To learn more about what you can do with MyChart, go to [email or web address].    Your next appointment:   6 month(s)  The format for your next appointment:   In Person  Provider:   Buford Dresser, MD   Important Information About [place]

## 2022-02-26 NOTE — Progress Notes (Signed)
Surgery orders requested via Epic inbox. °

## 2022-03-03 ENCOUNTER — Encounter (HOSPITAL_BASED_OUTPATIENT_CLINIC_OR_DEPARTMENT_OTHER): Payer: Self-pay | Admitting: Cardiology

## 2022-03-03 NOTE — H&P (Signed)
Patient's anticipated LOS is less than 2 midnights, meeting these requirements: - Younger than 61 - Lives within 1 hour of care - Has a competent adult at home to recover with post-op recover - NO history of  - Chronic pain requiring opiods  - Diabetes  - Coronary Artery Disease  - Heart failure  - Heart attack  - Stroke  - DVT/VTE  - Cardiac arrhythmia  - Respiratory Failure/COPD  - Renal failure  - Anemia  - Advanced Liver disease     Kristin Allen is an 77 y.o. female.    Chief Complaint: right shoulder pain   HPI: Pt is a 77 y.o. female complaining of right shoulder pain for multiple years. Pain had continually increased since the beginning. X-rays in the clinic show end-stage arthritic changes of the right shoulder. Pt has tried various conservative treatments which have failed to alleviate their symptoms, including injections and therapy. Various options are discussed with the patient. Risks, benefits and expectations were discussed with the patient. Patient understand the risks, benefits and expectations and wishes to proceed with surgery.   PCP:  Prince Solian, MD  D/C Plans: Home  PMH: Past Medical History:  Diagnosis Date   Anxiety    CAD (coronary artery disease)    positive test in 2011 with one stent placed in Dr Wynonia Lawman and colleagues   Glucose intolerance (malabsorption)    Heart disease    HTN (hypertension)    Hypothyroidism    Peripheral neuropathy    followed by Dr Erling Cruz every 5 years    PSH: Past Surgical History:  Procedure Laterality Date   [place]   by pass   [place]   stint  2010   TOTAL ABDOMINAL HYSTERECTOMY W/ BILATERAL SALPINGOOPHORECTOMY  1979    Social History:  reports that she has quit smoking. She has never used smokeless tobacco. She reports current alcohol use. She reports that she does not use drugs. BMI: Estimated body mass index is 38.86 kg/m as  calculated from the following:   Height as of 02/20/22: 5' (1.524 m).   Weight as of 02/20/22: 90.3 kg.  Lab Results  Component Value Date   ALBUMIN 4.2 05/23/2009   Diabetes: Patient does not have a diagnosis of diabetes.     Smoking Status:      Allergies:  Allergies  Allergen Reactions   Demerol [Meperidine]     hallucinations    Medications: No current facility-administered medications for this encounter.   Current Outpatient Medications  Medication Sig Dispense Refill   acetaminophen (TYLENOL) 500 MG tablet Take 1,000 mg by mouth every 6 (six) hours as needed for moderate pain.     ALPRAZolam (XANAX) 0.5 MG tablet Take 0.5 mg by mouth 3 (three) times daily as needed for anxiety.     amLODipine (NORVASC) 2.5 MG tablet Take 1 tablet (2.5 mg total) by mouth daily. (Patient taking differently: Take 2.5 mg by mouth every evening.) 90 tablet 3   aspirin EC 81 MG tablet Take 81 mg by mouth daily.     atorvastatin (LIPITOR) 40 MG tablet Take 40 mg by mouth every evening.     Calcium Carb-Cholecalciferol (CALCIUM 500 + D PO) Take 1 tablet by mouth 2 (two) times daily.     carvedilol (COREG) 12.5 MG tablet TAKE 1 TABLET BY MOUTH EVERY MORNING AND AT BEDTIME 180 tablet 2   escitalopram (LEXAPRO) 5 MG tablet  Take 5 mg by mouth daily.     furosemide (LASIX) 40 MG tablet Take 40-80 mg by mouth See admin instructions. Take 80 mg in the morning and 40 mg in the afternoon     gabapentin (NEURONTIN) 300 MG capsule Take 1,200 mg by mouth at bedtime.     HYDROcodone-acetaminophen (NORCO/VICODIN) 5-325 MG per tablet Take 1-2 tablets by mouth every 6 (six) hours as needed for moderate pain. 1-2 every 6 hours as needed     levothyroxine (SYNTHROID) 50 MCG tablet Take 50 mcg by mouth daily.     olmesartan (BENICAR) 40 MG tablet Take 40 mg by mouth daily.      No results found for this or any previous visit (from the past 48 hour(s)). No results found.  ROS: Pain with rom of the right upper  extremity  Physical Exam: Alert and oriented 77 y.o. female in no acute distress Cranial nerves 2-12 intact Cervical spine: full rom with no tenderness, nv intact distally Chest: active breath sounds bilaterally, no wheeze rhonchi or rales Heart: regular rate and rhythm, no murmur Abd: non tender non distended with active bowel sounds Hip is stable with rom  Right shoulder painful and weak rom Nv intact distally No rashes or edema distally  Assessment/Plan Assessment: right shoulder cuff arthropathy  Plan:  Patient will undergo a right reverse total shoulder by Dr. Veverly Fells at [place] Risks benefits and expectations were discussed with the patient. Patient understand risks, benefits and expectations and wishes to proceed. Preoperative templating of the joint replacement has been completed, documented, and submitted to the Operating Room personnel in order to optimize intra-operative equipment management.   Merla Riches PA-C, MPAS [facility] is now [employer] [address] [place], [place], [place] [address] Phone: [phone] [email or web address]  [employer]

## 2022-03-04 NOTE — Patient Instructions (Signed)
DUE TO COVID-19 ONLY TWO VISITORS  (aged 77 and older)  ARE ALLOWED TO COME WITH YOU AND STAY IN THE WAITING ROOM ONLY DURING PRE OP AND PROCEDURE.   **NO VISITORS ARE ALLOWED IN THE SHORT STAY AREA OR RECOVERY ROOM!!**  IF YOU WILL BE ADMITTED INTO THE HOSPITAL YOU ARE ALLOWED ONLY FOUR SUPPORT PEOPLE DURING VISITATION HOURS ONLY (7 AM -8PM)   The support person(s) must pass our screening, gel in and out, and wear a mask at all times, including in the patient's room. Patients must also wear a mask when staff or their support person are in the room. Visitors GUEST BADGE MUST BE WORN VISIBLY  One adult visitor may remain with you overnight and MUST be in the room by 8 P.M.     Your procedure is scheduled on: 03/20/22   Report to [facility] Main Entrance    Report to admitting at: 10:00 AM   Call this number if you have problems the morning of surgery [phone]   Do not eat food :After Midnight.   After Midnight you may have the following liquids until: 9:30 AM DAY OF SURGERY  Water Black Coffee (sugar ok, NO MILK/CREAM OR CREAMERS)  Tea (sugar ok, NO MILK/CREAM OR CREAMERS) regular and decaf                             Plain Jell-O (NO RED)                                           Fruit ices (not with fruit pulp, NO RED)                                     Popsicles (NO RED)                                                                  Juice: apple, WHITE grape, WHITE cranberry Sports drinks like Gatorade (NO RED)  = The day of surgery:  Drink ONE (1) Pre-Surgery Clear Ensure or G2 at: 9:30 AM the morning of surgery. Drink in one sitting. Do not sip.  This drink was given to you during your hospital  pre-op appointment visit. Nothing else to drink after completing the  Pre-Surgery Clear Ensure or G2.          If you have questions, please contact your surgeon's office.  Oral Hygiene is also important to reduce your risk of infection.                                     Remember - BRUSH YOUR TEETH THE MORNING OF SURGERY WITH YOUR REGULAR TOOTHPASTE  DENTURES WILL BE REMOVED PRIOR TO SURGERY PLEASE DO NOT APPLY "Poly grip" OR ADHESIVES!!!   Do NOT smoke after Midnight   Take these medicines the morning of surgery with A SIP OF WATER:escitalopram,carvedilol,levothyroxine.Tylenol,alprazolam as needed.   Bring CPAP mask and tubing day  of surgery.                              You may not have any metal on your body including hair pins, jewelry, and body piercing             Do not wear make-up, lotions, powders, perfumes/cologne, or deodorant  Do not wear nail polish including gel and S&S, artificial/acrylic nails, or any other type of covering on natural nails including finger and toenails. If you have artificial nails, gel coating, etc. that needs to be removed by a nail salon please have this removed prior to surgery or surgery may need to be canceled/ delayed if the surgeon/ anesthesia feels like they are unable to be safely monitored.   Do not shave  48 hours prior to surgery.    Do not bring valuables to the hospital. [place].   Contacts, glasses, or bridgework may not be worn into surgery.   Bring small overnight bag day of surgery.   DO NOT [place]. PHARMACY WILL DISPENSE MEDICATIONS LISTED ON YOUR MEDICATION LIST TO YOU DURING YOUR ADMISSION [place]!    Patients discharged on the day of surgery will not be allowed to drive home.  Someone NEEDS to stay with you for the first 24 hours after anesthesia.   Special Instructions: Bring a copy of your healthcare power of attorney and living will documents         the day of surgery if you haven't scanned them before.              Please read over the following fact sheets you were given: IF YOU HAVE QUESTIONS ABOUT YOUR PRE-OP INSTRUCTIONS PLEASE CALL [phone]    [facility] - Preparing for  Surgery Before surgery, you can play an important role.  Because skin is not sterile, your skin needs to be as free of germs as possible.  You can reduce the number of germs on your skin by washing with CHG (chlorahexidine gluconate) soap before surgery.  CHG is an antiseptic cleaner which kills germs and bonds with the skin to continue killing germs even after washing. Please DO NOT use if you have an allergy to CHG or antibacterial soaps.  If your skin becomes reddened/irritated stop using the CHG and inform your nurse when you arrive at Short Stay. Do not shave (including legs and underarms) for at least 48 hours prior to the first CHG shower.  You may shave your face/neck. Please follow these instructions carefully:  1.  Shower with CHG Soap the night before surgery and the  morning of Surgery.  2.  If you choose to wash your hair, wash your hair first as usual with your  normal  shampoo.  3.  After you shampoo, rinse your hair and body thoroughly to remove the  shampoo.                           4.  Use CHG as you would any other liquid soap.  You can apply chg directly  to the skin and wash                       Gently with a scrungie or  clean washcloth.  5.  Apply the CHG Soap to your body ONLY FROM THE NECK DOWN.   Do not use on face/ open                           Wound or open sores. Avoid contact with eyes, ears mouth and genitals (private parts).                       Wash face,  Genitals (private parts) with your normal soap.             6.  Wash thoroughly, paying special attention to the area where your surgery  will be performed.  7.  Thoroughly rinse your body with warm water from the neck down.  8.  DO NOT shower/wash with your normal soap after using and rinsing off  the CHG Soap.                9.  Pat yourself dry with a clean towel.            10.  Wear clean pajamas.            11.  Place clean sheets on your bed the night of your first shower and do not  sleep with pets. Day  of Surgery : Do not apply any lotions/deodorants the morning of surgery.  Please wear clean clothes to the hospital/surgery center.  FAILURE TO FOLLOW THESE INSTRUCTIONS MAY RESULT IN THE CANCELLATION OF YOUR SURGERY PATIENT SIGNATURE_________________________________  NURSE SIGNATURE__________________________________  ________________________________________________________________________[facility]- Preparing for Total Shoulder Arthroplasty    Before surgery, you can play an important role. Because skin is not sterile, your skin needs to be as free of germs as possible. You can reduce the number of germs on your skin by using the following products. Benzoyl Peroxide Gel Reduces the number of germs present on the skin Applied twice a day to shoulder area starting two days before surgery    ==================================================================  Please follow these instructions carefully:  BENZOYL PEROXIDE 5% GEL  Please do not use if you have an allergy to benzoyl peroxide.   If your skin becomes reddened/irritated stop using the benzoyl peroxide.  Starting two days before surgery, apply as follows: Apply benzoyl peroxide in the morning and at night. Apply after taking a shower. If you are not taking a shower clean entire shoulder front, back, and side along with the armpit with a clean wet washcloth.  Place a quarter-sized dollop on your shoulder and rub in thoroughly, making sure to cover the front, back, and side of your shoulder, along with the armpit.   2 days before ____ AM   ____ PM              1 day before ____ AM   ____ PM                         Do this twice a day for two days.  (Last application is the night before surgery, AFTER using the CHG soap as described below).  Do NOT apply benzoyl peroxide gel on the day of surgery.  Incentive Spirometer  An incentive spirometer is a tool that can help keep your lungs clear and active. This tool measures how  well you are filling your lungs with each breath. Taking long deep breaths may help reverse or decrease the chance of developing  breathing (pulmonary) problems (especially infection) following: A long period of time when you are unable to move or be active. BEFORE THE PROCEDURE  If the spirometer includes an indicator to show your best effort, your nurse or respiratory therapist will set it to a desired goal. If possible, sit up straight or lean slightly forward. Try not to slouch. Hold the incentive spirometer in an upright position. INSTRUCTIONS FOR USE  Sit on the edge of your bed if possible, or sit up as far as you can in bed or on a chair. Hold the incentive spirometer in an upright position. Breathe out normally. Place the mouthpiece in your mouth and seal your lips tightly around it. Breathe in slowly and as deeply as possible, raising the piston or the ball toward the top of the column. Hold your breath for 3-5 seconds or for as long as possible. Allow the piston or ball to fall to the bottom of the column. Remove the mouthpiece from your mouth and breathe out normally. Rest for a few seconds and repeat Steps 1 through 7 at least 10 times every 1-2 hours when you are awake. Take your time and take a few normal breaths between deep breaths. The spirometer may include an indicator to show your best effort. Use the indicator as a goal to work toward during each repetition. After each set of 10 deep breaths, practice coughing to be sure your lungs are clear. If you have an incision (the cut made at the time of surgery), support your incision when coughing by placing a pillow or rolled up towels firmly against it. Once you are able to get out of bed, walk around indoors and cough well. You may stop using the incentive spirometer when instructed by your caregiver.  RISKS AND COMPLICATIONS Take your time so you do not get dizzy or light-headed. If you are in pain, you may need to take or ask  for pain medication before doing incentive spirometry. It is harder to take a deep breath if you are having pain. AFTER USE Rest and breathe slowly and easily. It can be helpful to keep track of a log of your progress. Your caregiver can provide you with a simple table to help with this. If you are using the spirometer at home, follow these instructions: [place] IF:  You are having difficultly using the spirometer. You have trouble using the spirometer as often as instructed. Your pain medication is not giving enough relief while using the spirometer. You develop fever of 100.5 F (38.1 C) or higher. SEEK IMMEDIATE MEDICAL CARE IF:  You cough up bloody sputum that had not been present before. You develop fever of 102 F (38.9 C) or greater. You develop worsening pain at or near the incision site. MAKE SURE YOU:  Understand these instructions. Will watch your condition. Will get help right away if you are not doing well or get worse. Document Released: 06/29/2006 Document Revised: 05/11/2011 Document Reviewed: 08/30/2006 [facility] Patient Information 2014 [place], [place].   ________________________________________________________________________

## 2022-03-05 ENCOUNTER — Other Ambulatory Visit: Payer: Self-pay

## 2022-03-05 ENCOUNTER — Encounter (HOSPITAL_COMMUNITY): Payer: Self-pay

## 2022-03-05 ENCOUNTER — Encounter (HOSPITAL_COMMUNITY)
Admission: RE | Admit: 2022-03-05 | Discharge: 2022-03-05 | Disposition: A | Discharge status: Home / Self Care | Payer: Medicare Other | Source: Ambulatory Visit | Attending: Orthopedic Surgery | Admitting: Orthopedic Surgery

## 2022-03-05 VITALS — BP 180/100 | HR 61 | Temp 98.1°F | Ht 60.0 in | Wt 192.0 lb

## 2022-03-05 DIAGNOSIS — I10 Essential (primary) hypertension: Secondary | ICD-10-CM | POA: Insufficient documentation

## 2022-03-05 DIAGNOSIS — Z01812 Encounter for preprocedural laboratory examination: Secondary | ICD-10-CM | POA: Insufficient documentation

## 2022-03-05 DIAGNOSIS — Z01818 Encounter for other preprocedural examination: Secondary | ICD-10-CM

## 2022-03-05 HISTORY — DX: Unspecified osteoarthritis, unspecified site: M19.90

## 2022-03-05 HISTORY — DX: Acute myocardial infarction, unspecified: I21.9

## 2022-03-05 LAB — CBC
HCT: 40 % (ref 36.0–46.0)
Hemoglobin: 12.8 g/dL (ref 12.0–15.0)
MCH: 29.8 pg (ref 26.0–34.0)
MCHC: 32 g/dL (ref 30.0–36.0)
MCV: 93.2 fL (ref 80.0–100.0)
Platelets: 306 10*3/uL (ref 150–400)
RBC: 4.29 MIL/uL (ref 3.87–5.11)
RDW: 13.4 % (ref 11.5–15.5)
WBC: 10.4 10*3/uL (ref 4.0–10.5)
nRBC: 0 % (ref 0.0–0.2)

## 2022-03-05 LAB — BASIC METABOLIC PANEL
Anion gap: 11 (ref 5–15)
BUN: 11 mg/dL (ref 8–23)
CO2: 29 mmol/L (ref 22–32)
Calcium: 9.2 mg/dL (ref 8.9–10.3)
Chloride: 96 mmol/L — ABNORMAL LOW (ref 98–111)
Creatinine, Ser: 0.53 mg/dL (ref 0.44–1.00)
GFR, Estimated: >60 mL/min (ref >60)
Glucose, Bld: 102 mg/dL — ABNORMAL HIGH (ref 70–99)
Potassium: 4.1 mmol/L (ref 3.5–5.1)
Sodium: 136 mmol/L (ref 135–145)

## 2022-03-05 LAB — SURGICAL PCR SCREEN
MRSA, PCR: NEGATIVE
Staphylococcus aureus: NEGATIVE

## 2022-03-05 NOTE — Progress Notes (Addendum)
For Short Stay: [place] appointment date:  Bowel Prep reminder:   For Anesthesia: PCP - Dr. Lambert Mody Cardiologist - Dr. Buford Dresser. LOV: 02/20/22 Clearance: Raquel Sarna Monge: 01/02/22 Chest x-ray -  EKG - 02/20/22 Stress Test -  ECHO - 02/25/09 Cardiac Cath - 05/24/09 Pacemaker/ICD device last checked: Pacemaker orders received: Device Rep notified:  Spinal Cord Stimulator:  Sleep Study -  CPAP -   Fasting Blood Sugar -  Checks Blood Sugar _____ times a day Date and result of last Hgb A1c-  Last dose of GLP1 agonist-  GLP1 instructions:   Last dose of SGLT-2 inhibitors-  SGLT-2 instructions:   Blood Thinner Instructions: Aspirin Instructions: Last Dose:  Activity level: Can go up a flight of stairs and activities of daily living without stopping and without chest pain and/or shortness of breath   Able to exercise without chest pain and/or shortness of breath     Anesthesia review: Hx: CAD,HTN,MI,CABG. BP was high during PST appointment:: 200's/100's,manual BP 180/100  Patient denies shortness of breath, fever, cough and chest pain at PAT appointment   Patient verbalized understanding of instructions that were given to them at the PAT appointment. Patient was also instructed that they will need to review over the PAT instructions again at home before surgery.

## 2022-03-08 NOTE — Progress Notes (Signed)
Anesthesia Chart Review   Case: [id number] Date/Time: 03/20/22 1215   Procedure: REVERSE SHOULDER ARTHROPLASTY (Right: Shoulder) - 120 min choice with interscalene block   Anesthesia type: Choice   Pre-op diagnosis: right shoulder rotator cuff arthropathy   Location: [facility] ROOM 06 / [facility]   Surgeons: Netta Cedars, MD       DISCUSSION:77 y.o. former smoker with h/o HTN, chronic LBBB, CAD (CABG 1998, PCI 2011), ICM (EF 40-45%), right shoulder rotator cuff arthropathy scheduled for above procedure 03/20/2022 with Dr. Netta Cedars.   Pt last seen by cardiology 02/20/2022. Stable at this visit. Per previous preoperative evaluation by cardiology 01/02/2022, "According to the Revised Cardiac Risk Index (RCRI), her Perioperative Risk of Major Cardiac Event is (%): 0.9. Her Functional Capacity in METs is: 4.73 according to the Duke Activity Status Index (DASI). Therefore, based on ACC/AHA guidelines, patient would be at acceptable risk for the planned procedure without further cardiovascular testing."  Anticipate pt can proceed with planned procedure barring acute status change.   VS: There were no vitals taken for this visit.  PROVIDERS: Prince Solian, MD is PCP   Cardiologist - Dr. Buford Dresser  LABS: Labs reviewed: Acceptable for surgery. (all labs ordered are listed, but only abnormal results are displayed)  Labs Reviewed - No data to display   IMAGES:   EKG:   CV:  [place] LE Venous 07/20/2018: Summary:  Left: There is no evidence of deep vein thrombosis in the lower  extremity.There is no evidence of superficial venous thrombosis.   US Aorta 08/03/2017: FINDINGS: Abdominal aortic measurements as follows:   Proximal:  2.5 cm Mid:  3.2 cm in transverse dimension Distal:  2.0 cm   IMPRESSION: Mild ectasia of the infrarenal aorta. Recommend followup by ultrasound in 3 years. This recommendation follows ACR consensus guidelines: White Paper of the ACR Incidental  Findings Committee II on Vascular Findings. Joellyn Rued Radiol 2013; [phone]   Cath 05/24/2009 LV gram with EF 45%.  [place]: LM: 60-70% ostial LCx: occluded LAD: occluded after diagonal RCA: proximally occluded Grafts: SVG-RCA-PDA: widely patent SVG-OM: 99% proximal graft stenosis, PCI to this lesion LIMA-LAD-Diag: widely patent   Lexiscan 02/28/2009 Mild breast attenuation Moderately severe fixed defect in anteroseptal region consistent with prior MI No ischemia   Echo 02/20/2009 LV EF 40-45% Mild cLVH Hypokinesis of anterior, apex, lateral, septal walls No hemodynamically significant valve disease (Mild MR, trace TR)   EKG:  EKG ordered today, 02/20/22.  The ekg ordered today demonstrates sinus bradycardia/sinus arrhythmia at 57 bpm, LBBB  11/13/2020: NSR at 68. LBBB. 11/14/19: sinus bradycardia with PVC, LBBB, rate 54 bpm Past Medical History:  Diagnosis Date   Anxiety    Arthritis    CAD (coronary artery disease)    positive test in 2011 with one stent placed in Dr Wynonia Lawman and colleagues   Glucose intolerance (malabsorption)    Heart disease    HTN (hypertension)    Hypothyroidism    Myocardial infarction [facility])    Peripheral neuropathy    followed by Dr Erling Cruz every 5 years    Past Surgical History:  Procedure Laterality Date   [place]   by pass   CHOLECYSTECTOMY  1979   CORONARY ARTERY BYPASS GRAFT  1999   stint  2010   TOTAL ABDOMINAL HYSTERECTOMY W/ BILATERAL SALPINGOOPHORECTOMY  1979    MEDICATIONS: No current facility-administered medications for this encounter.    acetaminophen (TYLENOL) 500 MG tablet   ALPRAZolam (XANAX) 0.5 MG  tablet   amLODipine (NORVASC) 2.5 MG tablet   aspirin EC 81 MG tablet   atorvastatin (LIPITOR) 40 MG tablet   Calcium Carb-Cholecalciferol (CALCIUM 500 + D PO)   carvedilol (COREG) 12.5 MG tablet   escitalopram (LEXAPRO) 5 MG tablet   furosemide (LASIX) 40 MG tablet   gabapentin (NEURONTIN) 300 MG capsule    HYDROcodone-acetaminophen (NORCO/VICODIN) 5-325 MG per tablet   levothyroxine (SYNTHROID) 50 MCG tablet   olmesartan (BENICAR) 40 MG tablet     [facility], PA-C [facility] Pre-Surgical Testing [phone]

## 2022-03-08 NOTE — Anesthesia Preprocedure Evaluation (Addendum)
Anesthesia Evaluation  Patient identified by MRN, date of birth, ID band Patient awake    Reviewed: Allergy & Precautions, NPO status , Patient's Chart, lab work & pertinent test results, reviewed documented beta blocker date and time   Airway Mallampati: III  TM Distance: >3 FB Neck ROM: Full    Dental  (+) Teeth Intact, Dental Advisory Given   Pulmonary former smoker   Pulmonary exam normal breath sounds clear to auscultation       Cardiovascular hypertension, Pt. on home beta blockers and Pt. on medications + CAD, + Past MI, + Cardiac Stents and + CABG  Normal cardiovascular exam Rhythm:Regular Rate:Normal     Neuro/Psych  PSYCHIATRIC DISORDERS Anxiety     negative neurological ROS     GI/Hepatic negative GI ROS,,,(+)     substance abuse    Endo/Other  Hypothyroidism    Renal/GU negative Renal ROS  negative genitourinary   Musculoskeletal negative musculoskeletal ROS (+)  narcotic dependent  Abdominal   Peds  Hematology negative hematology ROS (+)   Anesthesia Other Findings 77 y.o. former smoker with h/o HTN, chronic LBBB, CAD (CABG 1998, PCI 2011), ICM (EF 40-45%)  Reproductive/Obstetrics                             Anesthesia Physical Anesthesia Plan  ASA: 3  Anesthesia Plan: General and Regional   Post-op Pain Management: Regional block* and Tylenol PO (pre-op)*   Induction: Intravenous  PONV Risk Score and Plan: 3 and Dexamethasone, Ondansetron and Treatment may vary due to age or medical condition  Airway Management Planned: Oral ETT  Additional Equipment:   Intra-op Plan:   Post-operative Plan: Extubation in OR  Informed Consent: I have reviewed the patients History and Physical, chart, labs and discussed the procedure including the risks, benefits and alternatives for the proposed anesthesia with the patient or authorized representative who has indicated his/her  understanding and acceptance.     Dental advisory given  Plan Discussed with: CRNA  Anesthesia Plan Comments: (See PAT note 03/08/2022)       Anesthesia Quick Evaluation

## 2022-03-20 ENCOUNTER — Other Ambulatory Visit: Payer: Self-pay

## 2022-03-20 ENCOUNTER — Encounter (HOSPITAL_COMMUNITY)
Admission: RE | Disposition: A | Discharge status: Home / Self Care | Payer: Self-pay | Source: Ambulatory Visit | Attending: Orthopedic Surgery

## 2022-03-20 ENCOUNTER — Ambulatory Visit (HOSPITAL_COMMUNITY): Payer: Medicare Other

## 2022-03-20 ENCOUNTER — Ambulatory Visit (HOSPITAL_COMMUNITY): Payer: Medicare Other | Admitting: Physician Assistant

## 2022-03-20 ENCOUNTER — Ambulatory Visit (HOSPITAL_COMMUNITY)
Admission: RE | Admit: 2022-03-20 | Discharge: 2022-03-20 | Disposition: A | Discharge status: Home / Self Care | Payer: Medicare Other | Source: Ambulatory Visit | Attending: Orthopedic Surgery | Admitting: Orthopedic Surgery

## 2022-03-20 ENCOUNTER — Encounter (HOSPITAL_COMMUNITY): Payer: Self-pay | Admitting: Orthopedic Surgery

## 2022-03-20 ENCOUNTER — Ambulatory Visit (HOSPITAL_BASED_OUTPATIENT_CLINIC_OR_DEPARTMENT_OTHER): Payer: Medicare Other | Admitting: Physician Assistant

## 2022-03-20 DIAGNOSIS — F419 Anxiety disorder, unspecified: Secondary | ICD-10-CM | POA: Diagnosis not present

## 2022-03-20 DIAGNOSIS — Z951 Presence of aortocoronary bypass graft: Secondary | ICD-10-CM | POA: Insufficient documentation

## 2022-03-20 DIAGNOSIS — M75101 Unspecified rotator cuff tear or rupture of right shoulder, not specified as traumatic: Secondary | ICD-10-CM | POA: Insufficient documentation

## 2022-03-20 DIAGNOSIS — I2510 Atherosclerotic heart disease of native coronary artery without angina pectoris: Secondary | ICD-10-CM

## 2022-03-20 DIAGNOSIS — M12811 Other specific arthropathies, not elsewhere classified, right shoulder: Secondary | ICD-10-CM

## 2022-03-20 DIAGNOSIS — Z955 Presence of coronary angioplasty implant and graft: Secondary | ICD-10-CM | POA: Diagnosis not present

## 2022-03-20 DIAGNOSIS — I252 Old myocardial infarction: Secondary | ICD-10-CM

## 2022-03-20 DIAGNOSIS — Z79891 Long term (current) use of opiate analgesic: Secondary | ICD-10-CM | POA: Insufficient documentation

## 2022-03-20 DIAGNOSIS — M25711 Osteophyte, right shoulder: Secondary | ICD-10-CM | POA: Diagnosis not present

## 2022-03-20 DIAGNOSIS — I10 Essential (primary) hypertension: Secondary | ICD-10-CM | POA: Insufficient documentation

## 2022-03-20 DIAGNOSIS — E039 Hypothyroidism, unspecified: Secondary | ICD-10-CM | POA: Insufficient documentation

## 2022-03-20 DIAGNOSIS — Z87891 Personal history of nicotine dependence: Secondary | ICD-10-CM | POA: Diagnosis not present

## 2022-03-20 HISTORY — PX: REVERSE SHOULDER ARTHROPLASTY: SHX5054

## 2022-03-20 SURGERY — ARTHROPLASTY, SHOULDER, TOTAL, REVERSE
Anesthesia: Regional | Site: Shoulder | Laterality: Right

## 2022-03-20 MED ORDER — SUGAMMADEX SODIUM 200 MG/2ML IV SOLN
INTRAVENOUS | Status: DC | PRN
  Administered 2022-03-20: 200 mg via INTRAVENOUS

## 2022-03-20 MED ORDER — ACETAMINOPHEN 500 MG PO TABS
1000.0000 mg | ORAL_TABLET | Freq: Once | ORAL | Status: DC
  Filled 2022-03-20: qty 2

## 2022-03-20 MED ORDER — PHENYLEPHRINE HCL (PRESSORS) 10 MG/ML IV SOLN
INTRAVENOUS | Status: AC
  Filled 2022-03-20: qty 1

## 2022-03-20 MED ORDER — BUPIVACAINE LIPOSOME 1.3 % IJ SUSP
INTRAMUSCULAR | Status: DC | PRN
  Administered 2022-03-20: 10 mL via PERINEURAL

## 2022-03-20 MED ORDER — FENTANYL CITRATE PF 50 MCG/ML IJ SOSY: 25.0000 ug | PREFILLED_SYRINGE | INTRAMUSCULAR | Status: DC | PRN

## 2022-03-20 MED ORDER — ROCURONIUM BROMIDE 10 MG/ML (PF) SYRINGE
PREFILLED_SYRINGE | INTRAVENOUS | Status: DC | PRN
  Administered 2022-03-20: 50 mg via INTRAVENOUS

## 2022-03-20 MED ORDER — PROPOFOL 10 MG/ML IV BOLUS
INTRAVENOUS | Status: DC | PRN
  Administered 2022-03-20: 100 mg via INTRAVENOUS

## 2022-03-20 MED ORDER — ROCURONIUM BROMIDE 10 MG/ML (PF) SYRINGE
PREFILLED_SYRINGE | INTRAVENOUS | Status: AC
  Filled 2022-03-20: qty 10

## 2022-03-20 MED ORDER — PROPOFOL 10 MG/ML IV BOLUS
INTRAVENOUS | Status: AC
  Filled 2022-03-20: qty 20

## 2022-03-20 MED ORDER — PHENYLEPHRINE HCL-NACL 20-0.9 MG/250ML-% IV SOLN
INTRAVENOUS | Status: DC | PRN
  Administered 2022-03-20: 50 ug/min via INTRAVENOUS

## 2022-03-20 MED ORDER — CHLORHEXIDINE GLUCONATE 0.12 % MT SOLN
15.0000 mL | Freq: Once | OROMUCOSAL | Status: AC
  Administered 2022-03-20: 15 mL via OROMUCOSAL

## 2022-03-20 MED ORDER — ORAL CARE MOUTH RINSE: 15.0000 mL | Freq: Once | OROMUCOSAL | Status: AC

## 2022-03-20 MED ORDER — TRANEXAMIC ACID-NACL 1000-0.7 MG/100ML-% IV SOLN
1000.0000 mg | INTRAVENOUS | Status: AC
  Administered 2022-03-20: 1000 mg via INTRAVENOUS
  Filled 2022-03-20: qty 100

## 2022-03-20 MED ORDER — CEFAZOLIN SODIUM-DEXTROSE 2-4 GM/100ML-% IV SOLN
2.0000 g | INTRAVENOUS | Status: AC
  Administered 2022-03-20: 2 g via INTRAVENOUS
  Filled 2022-03-20: qty 100

## 2022-03-20 MED ORDER — SODIUM CHLORIDE 0.9 % IR SOLN
Status: DC | PRN
  Administered 2022-03-20: 1000 mL

## 2022-03-20 MED ORDER — HYDROCODONE-ACETAMINOPHEN 5-325 MG PO TABS: 1.0000 | ORAL_TABLET | Freq: Four times a day (QID) | ORAL | 0 refills | Status: AC | PRN

## 2022-03-20 MED ORDER — LACTATED RINGERS IV SOLN: INTRAVENOUS | Status: DC

## 2022-03-20 MED ORDER — BUPIVACAINE HCL (PF) 0.5 % IJ SOLN
INTRAMUSCULAR | Status: DC | PRN
  Administered 2022-03-20: 15 mL via PERINEURAL

## 2022-03-20 MED ORDER — MIDAZOLAM HCL 2 MG/2ML IJ SOLN: 1.0000 mg | INTRAMUSCULAR | Status: DC

## 2022-03-20 MED ORDER — FENTANYL CITRATE PF 50 MCG/ML IJ SOSY
50.0000 ug | PREFILLED_SYRINGE | INTRAMUSCULAR | Status: DC
  Administered 2022-03-20: 50 ug via INTRAVENOUS
  Filled 2022-03-20: qty 2

## 2022-03-20 MED ORDER — STERILE WATER FOR IRRIGATION IR SOLN
Status: DC | PRN
  Administered 2022-03-20: 2000 mL

## 2022-03-20 MED ORDER — ONDANSETRON HCL 4 MG/2ML IJ SOLN
INTRAMUSCULAR | Status: DC | PRN
  Administered 2022-03-20: 4 mg via INTRAVENOUS

## 2022-03-20 SURGICAL SUPPLY — 74 items
AID PSTN UNV HD RSTRNT DISP (MISCELLANEOUS) ×1
BAG COUNTER SPONGE SURGICOUNT (BAG) IMPLANT
BAG SPEC THK2 15X12 ZIP CLS (MISCELLANEOUS)
BAG SPNG CNTER NS LX DISP (BAG)
BAG ZIPLOCK 12X15 (MISCELLANEOUS) IMPLANT
BIT DRILL 1.6MX128 (BIT) IMPLANT
BIT DRILL 170X2.5X (BIT) IMPLANT
BIT DRL 170X2.5X (BIT) ×1
BLADE SAG 18X100X1.27 (BLADE) ×2 IMPLANT
COVER BACK TABLE 60X90IN (DRAPES) ×2 IMPLANT
COVER SURGICAL LIGHT HANDLE (MISCELLANEOUS) ×2 IMPLANT
CUP D38 DXTEND STAND PLUS 6 HU (Orthopedic Implant) ×1 IMPLANT
CUP STD D38 DXTEND PLUS 6 HU (Orthopedic Implant) IMPLANT
DRAPE INCISE IOBAN 66X45 STRL (DRAPES) ×2 IMPLANT
DRAPE ORTHO SPLIT 77X108 STRL (DRAPES) ×2
DRAPE SHEET LG 3/4 BI-LAMINATE (DRAPES) ×2 IMPLANT
DRAPE SURG ORHT 6 SPLT 77X108 (DRAPES) ×4 IMPLANT
DRAPE TOP 10253 STERILE (DRAPES) ×2 IMPLANT
DRAPE U-SHAPE 47X51 STRL (DRAPES) ×2 IMPLANT
DRILL 2.5 (BIT) ×1
DRSG ADAPTIC 3X8 NADH LF (GAUZE/BANDAGES/DRESSINGS) ×2 IMPLANT
DURAPREP 26ML APPLICATOR (WOUND CARE) ×2 IMPLANT
ECCENTRIC EPIPHYSI MODULAR SZ1 (Trauma) IMPLANT
ELECT BLADE TIP CTD 4 INCH (ELECTRODE) ×2 IMPLANT
ELECT NDL TIP 2.8 STRL (NEEDLE) ×2 IMPLANT
ELECT NEEDLE TIP 2.8 STRL (NEEDLE) ×1 IMPLANT
ELECT REM PT RETURN 15FT ADLT (MISCELLANEOUS) ×2 IMPLANT
FACESHIELD WRAPAROUND (MASK) ×1 IMPLANT
FACESHIELD WRAPAROUND OR TEAM (MASK) ×2 IMPLANT
GAUZE PAD ABD 8X10 STRL (GAUZE/BANDAGES/DRESSINGS) ×2 IMPLANT
GAUZE SPONGE 4X4 12PLY STRL (GAUZE/BANDAGES/DRESSINGS) ×2 IMPLANT
GLENOSPHERE DELTA XTEND LAT 38 (Miscellaneous) IMPLANT
GLOVE BIOGEL PI IND STRL 7.5 (GLOVE) ×2 IMPLANT
GLOVE BIOGEL PI IND STRL 8.5 (GLOVE) ×2 IMPLANT
GLOVE ORTHO TXT STRL SZ7.5 (GLOVE) ×2 IMPLANT
GLOVE SURG ORTHO 8.5 STRL (GLOVE) ×2 IMPLANT
GOWN STRL REUS W/ TWL XL LVL3 (GOWN DISPOSABLE) ×4 IMPLANT
GOWN STRL REUS W/TWL XL LVL3 (GOWN DISPOSABLE) ×2
KIT BASIN OR (CUSTOM PROCEDURE TRAY) ×2 IMPLANT
KIT TURNOVER KIT A (KITS) IMPLANT
MANIFOLD NEPTUNE II (INSTRUMENTS) ×2 IMPLANT
METAGLENE DELTA EXTEND (Trauma) IMPLANT
METAGLENE DXTEND (Trauma) ×1 IMPLANT
MODULAR ECCENTRIC EPIPHYSI SZ1 (Trauma) ×1 IMPLANT
NDL MAYO CATGUT SZ4 TPR NDL (NEEDLE) ×2 IMPLANT
NEEDLE MAYO CATGUT SZ4 (NEEDLE) ×1 IMPLANT
NS IRRIG 1000ML POUR BTL (IV SOLUTION) ×2 IMPLANT
PACK SHOULDER (CUSTOM PROCEDURE TRAY) ×2 IMPLANT
PIN GUIDE 1.2 (PIN) IMPLANT
PIN GUIDE GLENOPHERE 1.5MX300M (PIN) IMPLANT
PIN METAGLENE 2.5 (PIN) IMPLANT
PROTECTOR NERVE ULNAR (MISCELLANEOUS) ×2 IMPLANT
RESTRAINT HEAD UNIVERSAL NS (MISCELLANEOUS) ×2 IMPLANT
SCREW 4.5X36MM (Screw) IMPLANT
SLING ARM FOAM STRAP LRG (SOFTGOODS) IMPLANT
SMARTMIX MINI TOWER (MISCELLANEOUS)
SPIKE FLUID TRANSFER (MISCELLANEOUS) ×2 IMPLANT
SPONGE T-LAP 4X18 ~~LOC~~+RFID (SPONGE) ×2 IMPLANT
STEM DELTA DIA 10 HA (Stem) IMPLANT
STRIP CLOSURE SKIN 1/2X4 (GAUZE/BANDAGES/DRESSINGS) ×2 IMPLANT
SUCTION FRAZIER HANDLE 10FR (MISCELLANEOUS) ×1
SUCTION TUBE FRAZIER 10FR DISP (MISCELLANEOUS) ×2 IMPLANT
SUT FIBERWIRE #2 38 T-5 BLUE (SUTURE) ×1
SUT MNCRL AB 4-0 PS2 18 (SUTURE) ×2 IMPLANT
SUT VIC AB 0 CT1 36 (SUTURE) ×4 IMPLANT
SUT VIC AB 0 CT2 27 (SUTURE) ×2 IMPLANT
SUT VIC AB 2-0 CT1 27 (SUTURE) ×1
SUT VIC AB 2-0 CT1 TAPERPNT 27 (SUTURE) ×2 IMPLANT
SUTURE FIBERWR #2 38 T-5 BLUE (SUTURE) ×4 IMPLANT
TAPE STRIPS DRAPE STRL (GAUZE/BANDAGES/DRESSINGS) IMPLANT
TAPE SURG TRANSPORE 1 IN (GAUZE/BANDAGES/DRESSINGS) IMPLANT
TAPE SURGICAL TRANSPORE 1 IN (GAUZE/BANDAGES/DRESSINGS) ×1
TOWEL OR 17X26 10 PK STRL BLUE (TOWEL DISPOSABLE) ×2 IMPLANT
TOWER SMARTMIX MINI (MISCELLANEOUS) IMPLANT

## 2022-03-20 NOTE — Op Note (Signed)
NAME: Kristin Allen, Kristin Allen MEDICAL RECORD NO: [id number] ACCOUNT NO: [id number] DATE OF BIRTH: 1945-11-04 FACILITY: [facility] LOCATION: [facility] PHYSICIAN: Doran Heater. Veverly Fells, MD  Operative Report   DATE OF PROCEDURE: 03/20/2022  PREOPERATIVE DIAGNOSIS:  Right shoulder rotator cuff tear arthropathy.  POSTOPERATIVE DIAGNOSIS:  Right shoulder rotator cuff tear arthropathy.  PROCEDURE PERFORMED:  Right reverse shoulder replacement using DePuy Delta Xtend prosthesis with no subscapularis repair.  ATTENDING SURGEON:  Doran Heater. Veverly Fells, MD  ASSISTANT:  Charletta Cousin [place], [place], who was scrubbed during the entire procedure, and necessary for satisfactory completion of surgery.  ANESTHESIA:  General anesthesia was used plus interscalene block.  ESTIMATED BLOOD LOSS:  150 mL.  FLUID REPLACEMENT:  1500 mL crystalloid.  COUNTS:  Instrument counts correct.  COMPLICATIONS:  No complications.  ANTIBIOTICS:  Perioperative antibiotics were given.  INDICATIONS:  The patient is a 76 year old female who presents with a history of worsening shoulder pain secondary to end-stage arthritis/rotator cuff tear arthropathy.  The patient has failed an extended period of conservative management, desires  operative treatment to eliminate pain and restore function.  Informed consent obtained.  DESCRIPTION OF PROCEDURE:  After an adequate level of anesthesia was achieved, the patient was positioned in modified beach chair position.  Right shoulder correctly identified and sterilely prepped and draped in the usual manner.  Timeout called,  verifying correct patient, correct site, we entered the patient's shoulder using standard deltopectoral approach, starting at the coracoid process extending down the anterior humerus.  Dissection down through subcutaneous tissues using Bovie.  Cephalic  vein was identified and taken laterally with the deltoid, pectoralis taken medially.  Conjoined tendon identified and retracted medially.   Biceps tenodesed in situ with 0 Vicryl figure-of-eight suture x2.  We released the subscapularis off the lesser  tuberosity and basically was just a remnant and we tagged it for protection of the axillary nerve.  We then released the inferior capsule progressively externally rotating and extending the shoulder and delivering the humerus out of the wound.  We  entered the proximal humerus with a 6 mm reamer, reaming up to a size 10.  We used a 10 mm T-handle guide and resected the head at 20 degrees of retroversion with the oscillating saw, we removed excess osteophytes with a rongeur.  We then subluxed the  humerus posteriorly, gaining good exposure of the glenoid.  We removed the capsule and labrum and then found our center point for a guide pin.  We placed our guide pin and then reamed for the metaglene baseplate.  We then did our peripheral hand reaming  with the T-handle reamer.  Next, we used the drill to drill out the central peg hole.  We then impacted the metaglene baseplate into position, which was centered low and had good bone support.  We then placed a 36 screw inferiorly, a 36 screw superiorly.   We had excellent baseplate stability.  We can only get superior and inferior screws due to the small AP diameter of the glenoid.  We then selected the 38+0 standard glenosphere and attached that to the baseplate with a screwdriver, we were pleased with  the position of that glenosphere and I did a finger sweep to make sure we had no soft tissue caught up between the baseplate and the glenosphere.  We then irrigated thoroughly, went back to the humeral side, reamed for the one right metaphysis.  We then  used the 10 stem, 1 right metaphysis set on the 0 setting and impacted  in 20 degrees of retroversion.  We reduced with a 38+3 poly trial felt like we probably get a +6.  We irrigated thoroughly, removed the trial components.  We then used available bone  graft and the HA coated press fit size 10 stem  with a 1 right HA coated as well set on the 0 setting and impaction grafted into about 20 degrees of retroversion.  We then selected the real 38+6 poly placed on the humeral tray impacted that and then  reduced the shoulder, had nice little pop as it reduced.  Appropriate tensioning of the conjoined and no gapping with inferior pole or external rotation.  We ranged the shoulder.  We had no impingement.  We irrigated thoroughly and repaired the  deltopectoral interval with 0 Vicryl suture followed by 2-0 Vicryl for subcutaneous closure and 4-0 Monocryl for skin.  Steri-Strips applied followed by sterile dressing.  The patient tolerated surgery well.   PUS D: 03/20/2022 12:16:29 pm T: 03/20/2022 12:45:00 pm  JOB: [id number]/ [id number]

## 2022-03-20 NOTE — Discharge Instructions (Signed)
Ice to the shoulder constantly.  Keep the incision covered and clean and dry for one week, then ok to get it wet in the shower.  Do exercise as instructed several times per day.  DO NOT reach behind your back or push up out of a chair with the operative arm.  Use a sling while you are up and around for comfort, may remove while seated.  Keep pillow propped behind the operative elbow.  Follow up with Dr Veverly Fells in two weeks in the office, call [phone] for appt  Call [phone] Dr Larina Earthly Phone with any questions or concerns.

## 2022-03-20 NOTE — Brief Op Note (Signed)
03/20/2022  12:11 PM  PATIENT:  Kristin Allen  77 y.o. female  PRE-OPERATIVE DIAGNOSIS:  right shoulder rotator cuff arthropathy  POST-OPERATIVE DIAGNOSIS:  right shoulder rotator cuff arthropathy  PROCEDURE:  Procedure(s) with comments: REVERSE SHOULDER ARTHROPLASTY (Right) - 120 min choice with interscalene block DePuy Delta Xtend with no subscap repair  SURGEON:  Surgeon(s) and Role:    Netta Cedars, MD - Primary  PHYSICIAN ASSISTANT:   ASSISTANTS: Ventura Bruns, PA-C   ANESTHESIA:   regional and general  EBL:  150cc  BLOOD ADMINISTERED:none  DRAINS: none   LOCAL MEDICATIONS USED:  NONE  SPECIMEN:  No Specimen  DISPOSITION OF SPECIMEN:  N/A  COUNTS:  YES  TOURNIQUET:  * No tourniquets in log *  DICTATION: .Other Dictation: Dictation Number [id number]  PLAN OF CARE: Discharge to home after PACU  PATIENT DISPOSITION:  PACU - hemodynamically stable.   Delay start of Pharmacological VTE agent (>24hrs) due to surgical blood loss or risk of bleeding: not applicable

## 2022-03-20 NOTE — Interval H&P Note (Signed)
History and Physical Interval Note:  03/20/2022 8:50 AM  Kristin Allen  has presented today for surgery, with the diagnosis of right shoulder rotator cuff arthropathy.  The various methods of treatment have been discussed with the patient and family. After consideration of risks, benefits and other options for treatment, the patient has consented to  Procedure(s) with comments: REVERSE SHOULDER ARTHROPLASTY (Right) - 120 min choice with interscalene block as a surgical intervention.  The patient's history has been reviewed, patient examined, no change in status, stable for surgery.  I have reviewed the patient's chart and labs.  Questions were answered to the patient's satisfaction.     Augustin Schooling

## 2022-03-20 NOTE — Evaluation (Signed)
Occupational Therapy Evaluation Patient Details Name: SHYLEIGH DAUGHTRY MRN: [id number] DOB: 1945-07-14 Today's Date: 03/20/2022   History of Present Illness Patient s/p right reverse shoulder arthroplasty   Clinical Impression   Mrs. Halimah Bewick is a 77 year old woman s/p shoulder replacement without functional use of right dominant upper extremity secondary to effects of surgery and interscalene block and shoulder precautions. Therapist provided education and instruction to patient and daughters in regards to exercises, precautions, positioning, donning upper extremity clothing and bathing while maintaining shoulder precautions, ice and edema management and donning/doffing sling. Patient and daughters verbalized understanding and handouts provided Patient needed assistance to donn shirt, underwear, pants, socks and shoes and provided with instruction on compensatory strategies to perform ADLs. Patient to follow up with MD for further therapy needs.        Recommendations for follow up therapy are one component of a multi-disciplinary discharge planning process, led by the attending physician.  Recommendations may be updated based on patient status, additional functional criteria and insurance authorization.   Follow Up Recommendations  Follow physician's recommendations for discharge plan and follow up therapies     Assistance Recommended at Discharge Intermittent Supervision/Assistance  Patient can return home with the following A little help with bathing/dressing/bathroom;Assistance with cooking/housework    Functional Status Assessment  Patient has had a recent decline in their functional status and demonstrates the ability to make significant improvements in function in a reasonable and predictable amount of time.  Equipment Recommendations  None recommended by OT    Recommendations for Other Services       Precautions / Restrictions Precautions Precautions: Shoulder Type of  Shoulder Precautions: No AROM, No PROM Shoulder Interventions: Shoulder sling/immobilizer;Off for dressing/bathing/exercises Precaution Booklet Issued: Yes (comment) (handouts) Required Braces or Orthoses: Sling Restrictions Weight Bearing Restrictions: Yes RUE Weight Bearing: Non weight bearing      Mobility Bed Mobility                    Transfers                          Balance Overall balance assessment: Mild deficits observed, not formally tested                                         ADL either performed or assessed with clinical judgement   ADL Overall ADL's : Needs assistance/impaired Eating/Feeding: Set up   Grooming: Set up   Upper Body Bathing: Minimal assistance   Lower Body Bathing: Minimal assistance   Upper Body Dressing : Maximal assistance   Lower Body Dressing: Moderate assistance   Toilet Transfer: Min guard   Toileting- Clothing Manipulation and Hygiene: Minimal assistance       Functional mobility during ADLs: Minimal assistance       Vision Patient Visual Report: No change from baseline       Perception     Praxis      Pertinent Vitals/Pain Pain Assessment Pain Assessment: Faces Faces Pain Scale: Hurts a little bit Pain Location: R shoulder Pain Descriptors / Indicators: Aching Pain Intervention(s): Limited activity within patient's tolerance     Hand Dominance     Extremity/Trunk Assessment Upper Extremity Assessment Upper Extremity Assessment: RUE deficits/detail RUE Deficits / Details: impaired motor control and sensation secondary to block   Lower Extremity Assessment Lower Extremity  Assessment: Overall WFL for tasks assessed   Cervical / Trunk Assessment Cervical / Trunk Assessment: Normal   Communication     Cognition Arousal/Alertness: Awake/alert Behavior During Therapy: WFL for tasks assessed/performed Overall Cognitive Status: Within Functional Limits for tasks  assessed                                       General Comments       Exercises     Shoulder Instructions Shoulder Instructions Donning/doffing shirt without moving shoulder: Caregiver independent with task Method for sponge bathing under operated UE: Caregiver independent with task Donning/doffing sling/immobilizer: Caregiver independent with task Correct positioning of sling/immobilizer: Caregiver independent with task ROM for elbow, wrist and digits of operated UE: Caregiver independent with task Sling wearing schedule (on at all times/off for ADL's): Caregiver independent with task Proper positioning of operated UE when showering: Caregiver independent with task Dressing change: Caregiver independent with task Positioning of UE while sleeping: Caregiver independent with task    Home Living Family/patient expects to be discharged to:: Private residence Living Arrangements: Alone Available Help at Discharge: Family;Available 24 hours/day                                    Prior Functioning/Environment                          OT Problem List: Decreased strength;Decreased range of motion;Impaired UE functional use      OT Treatment/Interventions:      OT Goals(Current goals can be found in the care plan section) Acute Rehab OT Goals OT Goal Formulation: All assessment and education complete, DC therapy  OT Frequency:      Co-evaluation              AM-PAC OT "6 Clicks" Daily Activity     Outcome Measure Help from another person eating meals?: A Little Help from another person taking care of personal grooming?: A Little Help from another person toileting, which includes using toliet, bedpan, or urinal?: A Little Help from another person bathing (including washing, rinsing, drying)?: A Little Help from another person to put on and taking off regular upper body clothing?: A Lot Help from another person to put on and taking  off regular lower body clothing?: A Lot 6 Click Score: 16   End of Session Nurse Communication:  (OT education complete)  Activity Tolerance: Patient tolerated treatment well Patient left: in chair;with call bell/phone within reach;with family/visitor present  OT Visit Diagnosis: Pain                Time: 1349-1401 OT Time Calculation (min): 12 min Charges:  OT General Charges $OT Visit: 1 Visit OT Evaluation $OT Eval Low Complexity: 1 Low  Gustavo Lah, OTR/L Acute Care Rehab Services  Office [phone]   Lenward Chancellor 03/20/2022, 2:17 PM

## 2022-03-20 NOTE — Anesthesia Procedure Notes (Signed)
Anesthesia Regional Block: Interscalene brachial plexus block   Pre-Anesthetic Checklist: , timeout performed,  Correct Patient, Correct Site, Correct Laterality,  Correct Procedure, Correct Position, site marked,  Risks and benefits discussed,  Pre-op evaluation,  At surgeon's request and post-op pain management  Laterality: Right  Prep: Maximum Sterile Barrier Precautions used, chloraprep       Needles:  Injection technique: Single-shot  Needle Type: Echogenic Stimulator Needle     Needle Length: 5cm  Needle Gauge: 21     Additional Needles:   Procedures:,,,, ultrasound used (permanent image in chart),,    Narrative:  Start time: 03/20/2022 9:27 AM End time: 03/20/2022 9:31 AM Injection made incrementally with aspirations every 5 mL. Anesthesiologist: Freddrick March, MD

## 2022-03-20 NOTE — Transfer of Care (Signed)
Immediate Anesthesia Transfer of Care Note  Patient: Kristin Allen  Procedure(s) Performed: REVERSE SHOULDER ARTHROPLASTY (Right: Shoulder)  Patient Location: PACU  Anesthesia Type:GA combined with regional for post-op pain  Level of Consciousness: awake, alert , and oriented  Airway & Oxygen Therapy: Patient Spontanous Breathing and Patient connected to face mask oxygen  Post-op Assessment: Report given to RN and Post -op Vital signs reviewed and stable  Post vital signs: Reviewed and stable  Last Vitals:  Vitals Value Taken Time  BP    Temp    Pulse 54 03/20/22 1216  Resp 14 03/20/22 1216  SpO2 100 % 03/20/22 1216  Vitals shown include unvalidated device data.  Last Pain:  Vitals:   03/20/22 1000  TempSrc:   PainSc: 0-No pain         Complications: No notable events documented.

## 2022-03-20 NOTE — Anesthesia Procedure Notes (Signed)
Procedure Name: Intubation Date/Time: 03/20/2022 10:45 AM  Performed by: [place] Manus Rudd, CRNAPre-anesthesia Checklist: Patient identified, Emergency Drugs available, Suction available and Patient being monitored Patient Re-evaluated:Patient Re-evaluated prior to induction Oxygen Delivery Method: Circle system utilized Preoxygenation: Pre-oxygenation with 100% oxygen Induction Type: IV induction Ventilation: Mask ventilation without difficulty Laryngoscope Size: Mac and 3 Grade View: Grade I Tube type: Oral Tube size: 7.0 mm Number of attempts: 1 Airway Equipment and Method: Stylet and Oral airway Placement Confirmation: ETT inserted through vocal cords under direct vision, positive ETCO2 and breath sounds checked- equal and bilateral Secured at: 21 cm Tube secured with: Tape Dental Injury: Teeth and Oropharynx as per pre-operative assessment

## 2022-03-23 ENCOUNTER — Encounter (HOSPITAL_COMMUNITY): Payer: Self-pay | Admitting: Orthopedic Surgery

## 2022-03-23 NOTE — Anesthesia Postprocedure Evaluation (Signed)
Anesthesia Post Note  Patient: Kristin Allen  Procedure(s) Performed: REVERSE SHOULDER ARTHROPLASTY (Right: Shoulder)     Patient location during evaluation: PACU Anesthesia Type: Regional and General Level of consciousness: awake and alert Pain management: pain level controlled Vital Signs Assessment: post-procedure vital signs reviewed and stable Respiratory status: spontaneous breathing, nonlabored ventilation, respiratory function stable and patient connected to nasal cannula oxygen Cardiovascular status: blood pressure returned to baseline and stable Postop Assessment: no apparent nausea or vomiting Anesthetic complications: no  No notable events documented.  Last Vitals:  Vitals:   03/20/22 1315 03/20/22 1345  BP: (!) 168/81 (!) 164/77  Pulse: (!) 56 (!) 55  Resp: 18   Temp: (!) 36.4 C 36.5 C  SpO2: 92% 93%    Last Pain:  Vitals:   03/20/22 1345  TempSrc:   PainSc: 0-No pain                 Vershawn Westrup L Zyann Mabry

## 2022-04-27 ENCOUNTER — Other Ambulatory Visit: Payer: Self-pay | Admitting: Cardiology

## 2022-04-27 NOTE — Telephone Encounter (Signed)
Rx request sent to pharmacy.  

## 2022-06-24 ENCOUNTER — Encounter: Payer: Self-pay | Admitting: Podiatry

## 2022-06-24 ENCOUNTER — Ambulatory Visit: Payer: Medicare Other | Admitting: Podiatry

## 2022-06-24 DIAGNOSIS — M79674 Pain in right toe(s): Secondary | ICD-10-CM

## 2022-06-24 DIAGNOSIS — G629 Polyneuropathy, unspecified: Secondary | ICD-10-CM | POA: Diagnosis not present

## 2022-06-24 MED ORDER — GABAPENTIN 800 MG PO TABS: 800.0000 mg | ORAL_TABLET | Freq: Three times a day (TID) | ORAL | 3 refills | Status: DC

## 2022-06-25 NOTE — Progress Notes (Signed)
Subjective:   Patient ID: Kristin Allen, female   DOB: 77 y.o.   MRN: [id number]   HPI Patient presents with significant nail disease 1-5 both feet and also her neuropathy does not seem to be under good control and she states that she gets a lot of pain especially at night   ROS      Objective:  Physical Exam  Vascular status moderately diminished intact neurological diminished sharp dull vibratory with patient found to have thickened nailbeds 1-5 both feet with incurvation and a lot of issues as far as neuropathic symptoms especially at nighttime with patient taking 1200 mg of gabapentin but that is been going on for a number of years it is not helping her to the same degree     Assessment:  Severe neuropathy with continued symptoms bilateral and also nail disease mycosis high risk factor with neuropathy with pain     Plan:  H&P reviewed both conditions.  As far as neuropathy goes I do think we can try to increase her gabapentin even though there is only so far she will be able to go but I like to try at least to get her some relief and we will increase her to 2 800 mg at night and if it is giving her trouble she will let us know.  I then debrided nailbeds 1-5 both feet no angiogenic bleeding reappoint routine care and did discuss other condition treatments for the neuropathy that could be considered

## 2022-08-19 ENCOUNTER — Ambulatory Visit (HOSPITAL_BASED_OUTPATIENT_CLINIC_OR_DEPARTMENT_OTHER): Payer: Medicare Other | Admitting: Cardiology

## 2022-10-02 ENCOUNTER — Ambulatory Visit (HOSPITAL_BASED_OUTPATIENT_CLINIC_OR_DEPARTMENT_OTHER): Payer: Medicare Other | Admitting: Cardiology

## 2022-10-02 ENCOUNTER — Encounter (HOSPITAL_BASED_OUTPATIENT_CLINIC_OR_DEPARTMENT_OTHER): Payer: Self-pay | Admitting: Cardiology

## 2022-10-02 VITALS — BP 117/74 | HR 63 | Ht 60.0 in | Wt 202.0 lb

## 2022-10-02 DIAGNOSIS — Z951 Presence of aortocoronary bypass graft: Secondary | ICD-10-CM

## 2022-10-02 DIAGNOSIS — I5042 Chronic combined systolic (congestive) and diastolic (congestive) heart failure: Secondary | ICD-10-CM | POA: Diagnosis not present

## 2022-10-02 DIAGNOSIS — I10 Essential (primary) hypertension: Secondary | ICD-10-CM

## 2022-10-02 DIAGNOSIS — E7800 Pure hypercholesterolemia, unspecified: Secondary | ICD-10-CM

## 2022-10-02 DIAGNOSIS — I2510 Atherosclerotic heart disease of native coronary artery without angina pectoris: Secondary | ICD-10-CM | POA: Diagnosis not present

## 2022-10-02 DIAGNOSIS — I255 Ischemic cardiomyopathy: Secondary | ICD-10-CM | POA: Diagnosis not present

## 2022-10-02 DIAGNOSIS — I447 Left bundle-branch block, unspecified: Secondary | ICD-10-CM

## 2022-10-02 NOTE — Patient Instructions (Addendum)
Medication Instructions:  The current medical regimen is effective;  continue present plan and medications.   *If you need a refill on your cardiac medications before your next appointment, please call your pharmacy*   Lab Work: None   Testing/Procedures: None   Follow-Up: At [facility] you and your health needs are our priority.  As part of our continuing mission to provide you with exceptional heart care, we have created designated Provider Care Teams.  These Care Teams include your primary Cardiologist (physician) and Advanced Practice Providers (APPs -  Physician Assistants and Nurse Practitioners) who all work together to provide you with the care you need, when you need it.  We recommend signing up for the patient portal called "MyChart".  Sign up information is provided on this After Visit Summary.  MyChart is used to connect with patients for Virtual Visits (Telemedicine).  Patients are able to view lab/test results, encounter notes, upcoming appointments, etc.  Non-urgent messages can be sent to your provider as well.   To learn more about what you can do with MyChart, go to [email or web address].    Your next appointment:   6 month(s)  Provider:   Jodelle Red, MD     Other Instructions If you need to be seen and I am not available, you can see Gillian Shields, who is a Oncologist that works in our office. You call the same office number to schedule with her as you would with me.

## 2022-10-02 NOTE — Progress Notes (Signed)
Cardiology Office Note:  .    Date:  10/02/2022  ID:  Kristin Allen, DOB August 21, 1945, MRN [id number] PCP: Chilton Greathouse, MD  [facility] Providers Cardiologist:  Jodelle Red, MD     History of Present Illness: Marland Kitchen    Kristin Allen is a 77 y.o. female with a hx of CAD, hypertension, hyperlipidemia, who presents for follow-up today.   She was previously followed by Dr. Donnie Aho. Has a history of CAD, first MI at age 60 (in 56) and then CABG in 1998. Had cath with Dr. Donnie Aho in 2011, had PCI to SVG at that time. Her anginal equivalent is left arm pain. Only her father had history of MI.   Patient was doing well from a CV perspective at her visit with Allen on 05/16/21. She did complain of right shoulder pain s/p an injury while reaching for her heavy purse. She was evaluated by ortho and underwent right reverse total shoulder arthroplasty with Dr. Beverely Low on 03/20/2022.    At her visit 01/2022, she was doing well from a cardiovascular perspective. Blood pressures were controlled both in and out of the office. Following recovery from her shoulder surgery, she was considering back surgery with Dr. Marikay Alar.  Note from Dr. Felipa Eth 06/15/22 noted bradycardia, carvedilol decreased to 6.25 mg BID, amlodipine increased to 5 mg for BP control. Also noted palpitations around meal time.  Today, she states she is feeling just okay. No new heart concerns at this time. Sometimes has noticed higher blood pressures but she is asymptomatic.   Since her last visit, had one day where she felt very dizzy, spinning. She was unable to stand up. Believes she was dehydrated. She took a motion-sickness pill with subsequent improvement.  She confirms some swelling that has been slightly worse in left leg and arm, usually more consistent throughout the day.  Over a week she cleaned her home, completing a little bit at a time. No chest pain or heaviness.   She denies any chest pain, shortness of  breath, headaches, syncope, orthopnea, or PND.  ROS:  Please see the history of present illness. ROS otherwise negative except as noted.  (+) Recent episode of dizziness (+) LE edema  Studies Reviewed: Marland Kitchen       No new studies  Physical Exam:    VS:  BP 117/74 (BP Location: Left Arm, Patient Position: Sitting, Cuff Size: Large)   Pulse 63   Ht 5' (1.524 m)   Wt 202 lb (91.6 kg)   SpO2 95%   BMI 39.45 kg/m    Wt Readings from Last 3 Encounters:  10/02/22 202 lb (91.6 kg)  03/20/22 191 lb 12.8 oz (87 kg)  03/05/22 192 lb (87.1 kg)    GEN: Well nourished, well developed in no acute distress HEENT: Normal, moist mucous membranes NECK: No JVD CARDIAC: regular rhythm, normal S1 and S2, no rubs or gallops. No murmur. VASCULAR: Radial and DP pulses 2+ bilaterally. No carotid bruits RESPIRATORY:  Mild atelectasis but otherwise clear to auscultation without rales, wheezing or rhonchi.  ABDOMEN: Soft, non-tender, non-distended MUSCULOSKELETAL:  Ambulates independently SKIN: Warm and dry, trace LLE edema NEUROLOGIC:  Alert and oriented x 3. No focal neuro deficits noted. PSYCHIATRIC:  Normal affect   ASSESSMENT AND PLAN: .    LE edema:  -much improved, though does not tolerate higher amlodipine dose. Ok to continue 2.5 mg dose amlodipine as BP well controlled.  -continue furosemide -discussed compression, elevation, salt avoidance  CAD s/p CABG Ischemic cardiomyopathy, EF 40-45% -on aspirin -on high intensity statin -on beta blocker and ARB -denies angina -NYHA class 1   Hyperlipidemia:  -lipids 06/2022 with LDL 74, near goal -continue atorvastatin 40 mg.   Hypertension, with reported hypertensive heart disease per notes:  -continue furosemide 80 mg in AM and 40 mg in PM -continue amlodipine 2.5 mg daily -continue carvedilol, per notes, decreased to 6.25 mg BID by Dr Felipa Eth but still listed as 12.5 mg BID on list. She will check to see what she is taking -continue  olmesartan 40 mg daily -given CAD, consider spironolactone in the future if potassium/renal function allows   LBBB: not new   Secondary prevention recommendations: -recommend heart healthy/Mediterranean diet, with whole grains, fruits, vegetable, fish, lean meats, nuts, and olive oil. Limit salt. -recommend moderate walking, 3-5 times/week for 30-50 minutes each session. Aim for at least 150 minutes.week. Goal should be pace of 3 miles/hours, or walking 1.5 miles in 30 minutes -recommend avoidance of tobacco products. Avoid excess alcohol.  Dispo: Follow-up in 6 months, or sooner as needed.  I,Mathew Stumpf,acting as a Neurosurgeon for [employer], MD.,have documented all relevant documentation on the behalf of Jodelle Red, MD,as directed by  Jodelle Red, MD while in the presence of Jodelle Red, MD.  I, Jodelle Red, MD, have reviewed all documentation for this visit. The documentation on 10/02/22 for the exam, diagnosis, procedures, and orders are all accurate and complete.   Signed, Jodelle Red, MD

## 2023-04-07 ENCOUNTER — Encounter (HOSPITAL_BASED_OUTPATIENT_CLINIC_OR_DEPARTMENT_OTHER): Payer: Self-pay | Admitting: Cardiology

## 2023-04-07 ENCOUNTER — Ambulatory Visit (HOSPITAL_BASED_OUTPATIENT_CLINIC_OR_DEPARTMENT_OTHER): Payer: Medicare Other | Admitting: Cardiology

## 2023-04-07 VITALS — BP 134/80 | HR 69 | Ht 60.0 in | Wt 191.7 lb

## 2023-04-07 DIAGNOSIS — I255 Ischemic cardiomyopathy: Secondary | ICD-10-CM | POA: Diagnosis not present

## 2023-04-07 DIAGNOSIS — I2510 Atherosclerotic heart disease of native coronary artery without angina pectoris: Secondary | ICD-10-CM

## 2023-04-07 DIAGNOSIS — E7800 Pure hypercholesterolemia, unspecified: Secondary | ICD-10-CM

## 2023-04-07 DIAGNOSIS — I5042 Chronic combined systolic (congestive) and diastolic (congestive) heart failure: Secondary | ICD-10-CM

## 2023-04-07 DIAGNOSIS — Z951 Presence of aortocoronary bypass graft: Secondary | ICD-10-CM | POA: Diagnosis not present

## 2023-04-07 DIAGNOSIS — I10 Essential (primary) hypertension: Secondary | ICD-10-CM | POA: Diagnosis not present

## 2023-04-07 DIAGNOSIS — I447 Left bundle-branch block, unspecified: Secondary | ICD-10-CM

## 2023-04-07 NOTE — Patient Instructions (Addendum)
 Medication Instructions:  Your physician recommends that you continue on your current medications as directed. Please refer to the Current Medication list given to you today.  *If you need a refill on your cardiac medications before your next appointment, please call your pharmacy*  Follow-Up: At [facility], you and your health needs are our priority.  As part of our continuing mission to provide you with exceptional heart care, we have created designated Provider Care Teams.  These Care Teams include your primary Cardiologist (physician) and Advanced Practice Providers (APPs -  Physician Assistants and Nurse Practitioners) who all work together to provide you with the care you need, when you need it.  We recommend signing up for the patient portal called MyChart.  Sign up information is provided on this After Visit Summary.  MyChart is used to connect with patients for Virtual Visits (Telemedicine).  Patients are able to view lab/test results, encounter notes, upcoming appointments, etc.  Non-urgent messages can be sent to your provider as well.   To learn more about what you can do with MyChart, go to [email or web address].    Your next appointment:   6 month(s)  Provider:   Shelda Bruckner, MD    Other Instructions how to check blood pressure:  -sit comfortably in a chair, feet uncrossed and flat on floor, for 5-10 minutes  -arm ideally should rest at the level of the heart. However, arm should be relaxed and not tense (for example, do not hold the arm up unsupported)  -avoid exercise, caffeine, and tobacco for at least 30 minutes prior to BP reading  -don't take BP cuff reading over clothes (always place on skin directly)  -I prefer to know how well the medication is working, so I would like you to take your readings 1-2 hours after taking your blood pressure medication if possible   Send a message or call us  with some numbers in about three weeks. If still >130/80,  we will talk about adding a medication at a low dose.

## 2023-04-07 NOTE — Progress Notes (Signed)
 Cardiology Office Note:  .    Date:  04/07/2023  ID:  Kristin Allen, DOB Oct 28, 1945, MRN [id number] PCP: Kristin Santos, MD  [facility] Providers Cardiologist:  Kristin Bruckner, MD     History of Present Illness: Kristin    Kristin Allen is a 78 y.o. female with a hx of CAD, hypertension, hyperlipidemia, who presents for follow-up today.   She was previously followed by Dr. Blanca. Has a history of CAD, first MI at age 62 (in 45) and then CABG in 1998. Had cath with Dr. Blanca in 2011, had PCI to SVG at that time. Her anginal equivalent is left arm pain. Only her father had history of MI.   Today: Overall doing ok. Back and feet cause her the most issues. Trying to avoid surgery, discussing injections and physical therapy. No recent worsening edema.   Blood pressure is a bit high today. Improved on recheck. Had some stress on initial arrival to office. Discussed options for management, see below.  LE edema no longer problematic for her.  Denies chest pain, shortness of breath at rest or with normal exertion. No PND, orthopnea, LE edema or unexpected weight gain. No syncope or palpitations. ROS otherwise negative except as noted.   ROS:  Please see the history of present illness. ROS otherwise negative except as noted.   Studies Reviewed: Kristin    EKG Interpretation Date/Time:  Wednesday April 07 2023 15:32:21 EST Ventricular Rate:  62 PR Interval:  160 QRS Duration:  156 QT Interval:  478 QTC Calculation: 485 R Axis:   -5  Text Interpretation: Normal sinus rhythm Left bundle branch block When compared with ECG of 25-May-2009 08:24, Premature ventricular complexes are no longer Present Confirmed by Allen Kristin [phone]) on 04/07/2023 4:06:01 PM   Physical Exam:    VS:  BP 134/80   Pulse 69   Ht 5' (1.524 m)   Wt 191 lb 11.2 oz (87 kg)   SpO2 94%   BMI 37.44 kg/m    Wt Readings from Last 3 Encounters:  04/07/23 191 lb 11.2 oz (87 kg)  10/02/22  202 lb (91.6 kg)  03/20/22 191 lb 12.8 oz (87 kg)    GEN: Well nourished, well developed in no acute distress HEENT: Normal, moist mucous membranes NECK: No JVD CARDIAC: regular rhythm, normal S1 and S2, no rubs or gallops. No murmur. VASCULAR: Radial and DP pulses 2+ bilaterally. No carotid bruits RESPIRATORY:  Clear to auscultation without rales, wheezing or rhonchi  ABDOMEN: Soft, non-tender, non-distended MUSCULOSKELETAL:  Ambulates independently SKIN: Warm and dry, no edema NEUROLOGIC:  Alert and oriented x 3. No focal neuro deficits noted. PSYCHIATRIC:  Normal affect    ASSESSMENT AND PLAN: .    LE edema: improved -does not tolerate higher amlodipine  dose. Ok to continue 2.5 mg dose amlodipine  as BP well controlled.  -continue furosemide -discussed compression, elevation, salt avoidance   CAD s/p CABG Ischemic cardiomyopathy, EF 40-45% -on aspirin -on high intensity statin -on beta blocker and ARB -discussed spironolactone  and SGLT2i today. She has blood pressure cuff at home and would rather check at home for a while before adding a medication -denies angina -NYHA class 1   Hyperlipidemia:  -lipids 06/2022 with LDL 74, near goal. She would like to wait for her annual labs before making a change -continue atorvastatin 40 mg.   Hypertension, with reported hypertensive heart disease per notes:  -continue furosemide 80 mg in AM and 40 mg in PM -continue  amlodipine  2.5 mg daily -continue carvedilol , 12.5 mg BID -continue olmesartan 40 mg daily -given CAD, consider spironolactone  in the future if potassium/renal function allows. Also discussed SGLT2i as above.   LBBB: not new   Secondary prevention recommendations: -recommend heart healthy/Mediterranean diet, with whole grains, fruits, vegetable, fish, lean meats, nuts, and olive oil. Limit salt. -recommend moderate walking, 3-5 times/week for 30-50 minutes each session. Aim for at least 150 minutes.week. Goal should be  pace of 3 miles/hours, or walking 1.5 miles in 30 minutes -recommend avoidance of tobacco products. Avoid excess alcohol.  Dispo: Follow-up in 6 months, or sooner as needed. She will send BP numbers in a few weeks  Signed, Kristin Bruckner, MD

## 2023-04-21 ENCOUNTER — Other Ambulatory Visit: Payer: Self-pay | Admitting: Cardiology

## 2023-04-22 ENCOUNTER — Telehealth: Payer: Self-pay | Admitting: Cardiology

## 2023-04-22 DIAGNOSIS — I10 Essential (primary) hypertension: Secondary | ICD-10-CM

## 2023-04-22 NOTE — Telephone Encounter (Addendum)
Average BP based on those readings 141/86. Rather labile BP during the day with range 119/74-162/90.   Would ensure she is sitting and resting 5-10 minutes prior to checking BP. Will ask triage team to ensure she doesn't note any variability in times of BP - for example is BP always high in morning and low at night?   She and Dr. Cristal Deer discussed addition of spironolactone or SGLT2i. As BP not routinely at goal, would be beneficial to add. Will request triage team to see if Miss Mangal had thought further about either of these medications.   Alver Sorrow, NP

## 2023-04-22 NOTE — Telephone Encounter (Signed)
Pt c/o medication issue:  1. Name of Medication: amLODipine (NORVASC) 2.5 MG tablet   carvedilol (COREG) 12.5 MG tablet   2. How are you currently taking this medication (dosage and times per day)?    3. Are you having a reaction (difficulty breathing--STAT)? no  4. What is your medication issue? Patient calling in about how she takes her medication and making adjustment .Please advise

## 2023-04-22 NOTE — Telephone Encounter (Signed)
Called and spoke to patient. Patient needed clarification on two medications - carvedilol and amlodipine.   Patient was currently taking:  Carvedilol 6.25 mg BID Amlodipine 5mg  at night  Patient educated on taking the following meds as prescribed per OV on 04/07/23:  Hypertension, with reported hypertensive heart disease per notes:  -continue furosemide 80 mg in AM and 40 mg in PM -continue amlodipine 2.5 mg daily -continue carvedilol, 12.5 mg BID -continue olmesartan 40 mg daily  Recent BP log per patient is as following:  134/87 140/81 122/71 151/82 162/90 119/74 133/78 165/96  Will forward this to MD and/or APP for any further recommendations and will call pt back tomorrow. She verbalized understanding. Pt verified taking all blood pressures after taking medications.

## 2023-04-23 MED ORDER — SPIRONOLACTONE 25 MG PO TABS: 12.5000 mg | ORAL_TABLET | Freq: Every day | ORAL | 1 refills | Status: DC

## 2023-04-23 NOTE — Telephone Encounter (Signed)
Pt returned call. Informed of new med recommendations and labs needed. She verbalized understanding. New rx sent to local pharmacy. Pt to pick up lab slips on Monday prior to getting labs drawn.

## 2023-04-23 NOTE — Telephone Encounter (Signed)
Patient returned staff call. 

## 2023-04-23 NOTE — Telephone Encounter (Signed)
Spoke to Dr. Cristal Deer in regards of medications and pt's labile BP. Dr. Cristal Deer requested BMP x1 then prescribe Spironolactone 12.5 mg daily and repeat BMP in 1-2 weeks of taking Cleda Daub to recheck Potassium and kidney functions.  Called pt, no answer at this time. Left VM to call back.

## 2023-04-27 LAB — BASIC METABOLIC PANEL
BUN/Creatinine Ratio: 18 (ref 12–28)
BUN: 12 mg/dL (ref 8–27)
CO2: 26 mmol/L (ref 20–29)
Calcium: 9.5 mg/dL (ref 8.7–10.3)
Chloride: 97 mmol/L (ref 96–106)
Creatinine, Ser: 0.68 mg/dL (ref 0.57–1.00)
Glucose: 95 mg/dL (ref 70–99)
Potassium: 4.4 mmol/L (ref 3.5–5.2)
Sodium: 138 mmol/L (ref 134–144)
eGFR: 90 mL/min/{1.73_m2} (ref >59)

## 2023-05-06 ENCOUNTER — Encounter (HOSPITAL_BASED_OUTPATIENT_CLINIC_OR_DEPARTMENT_OTHER): Payer: Self-pay

## 2023-06-04 ENCOUNTER — Other Ambulatory Visit: Payer: Self-pay

## 2023-06-04 DIAGNOSIS — I10 Essential (primary) hypertension: Secondary | ICD-10-CM

## 2023-06-04 MED ORDER — AMLODIPINE BESYLATE 2.5 MG PO TABS: 2.5000 mg | ORAL_TABLET | Freq: Every day | ORAL | 3 refills | Status: AC

## 2023-06-29 ENCOUNTER — Other Ambulatory Visit: Payer: Self-pay | Admitting: Cardiology

## 2023-06-30 ENCOUNTER — Telehealth: Payer: Self-pay | Admitting: Cardiology

## 2023-06-30 MED ORDER — CARVEDILOL 12.5 MG PO TABS: 12.5000 mg | ORAL_TABLET | Freq: Two times a day (BID) | ORAL | 3 refills | Status: AC

## 2023-06-30 NOTE — Telephone Encounter (Signed)
*  STAT* If patient is at the pharmacy, call can be transferred to refill team.   1. Which medications need to be refilled? (please list name of each medication and dose if known)  carvedilol  (COREG ) 12.5 MG tablet  2. Which pharmacy/location (including street and city if local pharmacy) is medication to be sent to? [id number] - [place], [place] - [address]   3. Do they need a 30 day or 90 day supply?  90 day supply + refills  I called pharmacy and they claim to have never received prescription. E-script is preferred.

## 2024-02-04 ENCOUNTER — Ambulatory Visit (HOSPITAL_BASED_OUTPATIENT_CLINIC_OR_DEPARTMENT_OTHER): Admitting: Cardiology

## 2024-02-04 ENCOUNTER — Encounter (HOSPITAL_BASED_OUTPATIENT_CLINIC_OR_DEPARTMENT_OTHER): Payer: Self-pay | Admitting: Cardiology

## 2024-02-04 VITALS — BP 144/72 | HR 55 | Ht 60.0 in | Wt 181.5 lb

## 2024-02-04 DIAGNOSIS — I255 Ischemic cardiomyopathy: Secondary | ICD-10-CM

## 2024-02-04 DIAGNOSIS — E7800 Pure hypercholesterolemia, unspecified: Secondary | ICD-10-CM

## 2024-02-04 DIAGNOSIS — Z951 Presence of aortocoronary bypass graft: Secondary | ICD-10-CM

## 2024-02-04 DIAGNOSIS — I10 Essential (primary) hypertension: Secondary | ICD-10-CM

## 2024-02-04 DIAGNOSIS — I2510 Atherosclerotic heart disease of native coronary artery without angina pectoris: Secondary | ICD-10-CM | POA: Diagnosis not present

## 2024-02-04 NOTE — Progress Notes (Signed)
  Cardiology Office Note:  .    Date:  02/04/2024  ID:  [place] Kristin Allen, DOB April 16, 1945, MRN [id number] PCP: Janey Santos, MD  [facility] Providers Cardiologist:  Shelda Bruckner, MD     History of Present Illness: Kristin Allen    Kristin Allen is a 78 y.o. female with a hx of CAD, hypertension, hyperlipidemia, who presents for follow-up today.   She was previously followed by Dr. Blanca. Has a history of CAD, first MI at age 89 (in 70) and then CABG in 1998. Had cath with Dr. Blanca in 2011, had PCI to SVG at that time. Her anginal equivalent is left arm pain. Only her father had history of MI.   Today: Had rattling in her chest/coughing last week, almost went to ER. Went to see Dr. Janey, was put on prednisone and antibiotic. Doesn't feel like it is helping much. Added mucinex. Still coughing a lot, nothing really coming up, has pleuritic pain when she coughs. She had an inhaler called in, and this seems to be helping.   ROS:  Denies PND, orthopnea, LE edema or unexpected weight gain. No syncope or palpitations. ROS otherwise negative except as noted.   Studies Reviewed: Kristin Allen        Physical Exam:    VS:  BP (!) 144/72   Pulse (!) 55   Ht 5' (1.524 m)   Wt 181 lb 8 oz (82.3 kg)   SpO2 95%   BMI 35.45 kg/m    Wt Readings from Last 3 Encounters:  02/04/24 181 lb 8 oz (82.3 kg)  04/07/23 191 lb 11.2 oz (87 kg)  10/02/22 202 lb (91.6 kg)    GEN: Well nourished, well developed in no acute distress HEENT: Normal, moist mucous membranes NECK: No JVD CARDIAC: regular rhythm, normal S1 and S2, no rubs or gallops. No murmur. VASCULAR: Radial and DP pulses 2+ bilaterally. No carotid bruits RESPIRATORY:  wheezing/rhonchi on left ABDOMEN: Soft, non-tender, non-distended MUSCULOSKELETAL:  Ambulates independently SKIN: Warm and dry, no pitting edema NEUROLOGIC:  Alert and oriented x 3. No focal neuro deficits noted. PSYCHIATRIC:  Normal affect    ASSESSMENT AND PLAN: .     CAD s/p CABG Ischemic cardiomyopathy, EF 40-45% Hypercholesterolemia -on aspirin -on beta blocker, ARB, spironolactone  -denies angina -NYHA class 1 -lipids per [facility] 05/2023, LDL 42, at goal -continue atorvastatin 40 mg -Dr. Janey brought up GLP with her. BMI 35 but has history of CAD. Discussed GLP at length with her today. She will consider, especially if covered by her insurance   Hypertension, with reported hypertensive heart disease per notes:  -elevated today but ill and on prednisone -continue furosemide 80 mg in AM and 40 mg in PM -continue amlodipine  2.5 mg daily -continue carvedilol , 12.5 mg BID -continue olmesartan 40 mg daily -continue spironolactone  12.5 mg daily   LBBB: not new  LE edema: improved -does not tolerate higher amlodipine  dose. Continue 2.5 mg dose amlodipine .  -continue furosemide -discussed compression, elevation, salt avoidance   Secondary prevention recommendations: -recommend heart healthy/Mediterranean diet, with whole grains, fruits, vegetable, fish, lean meats, nuts, and olive oil. Limit salt. -recommend moderate walking, 3-5 times/week for 30-50 minutes each session. Aim for at least 150 minutes.week. Goal should be pace of 3 miles/hours, or walking 1.5 miles in 30 minutes -recommend avoidance of tobacco products. Avoid excess alcohol.  Dispo: Follow-up in 6 months, or sooner as needed.   Signed, Shelda Bruckner, MD

## 2024-02-04 NOTE — Patient Instructions (Signed)
 Medication Instructions:  No changes *If you need a refill on your cardiac medications before your next appointment, please call your pharmacy*  Lab Work: none If you have labs (blood work) drawn today and your tests are completely normal, you will receive your results only by: MyChart Message (if you have MyChart) OR A paper copy in the mail If you have any lab test that is abnormal or we need to change your treatment, we will call you to review the results.  Testing/Procedures: none  Follow-Up: At [facility], you and your health needs are our priority.  As part of our continuing mission to provide you with exceptional heart care, our providers are all part of one team.  This team includes your primary Cardiologist (physician) and Advanced Practice Providers or APPs (Physician Assistants and Nurse Practitioners) who all work together to provide you with the care you need, when you need it.  Your next appointment:   6 month(s)  Provider:   Shelda Bruckner, MD, Rosaline Bane, NP, or Reche Finder, NP

## 2024-03-23 ENCOUNTER — Other Ambulatory Visit: Payer: Self-pay | Admitting: Cardiology

## 2024-03-24 ENCOUNTER — Telehealth: Payer: Self-pay | Admitting: Cardiology

## 2024-03-24 NOTE — Telephone Encounter (Signed)
" °*  STAT* If patient is at the pharmacy, call can be transferred to refill team.   1. Which medications need to be refilled? (please list name of each medication and dose if known)  spironolactone  (ALDACTONE ) 25 MG tablet     2. Would you like to learn more about the convenience, safety, & potential cost savings by using the [facility]? no   3. Are you open to using the [facility] (Type [facility]. no   4. Which pharmacy/location (including street and city if local pharmacy) is medication to be sent to?  [id number] - [place], [place] - [address]    5. Do they need a 30 day or 90 day supply? 90 day   "

## 2024-03-28 ENCOUNTER — Other Ambulatory Visit (HOSPITAL_COMMUNITY): Payer: Self-pay

## 2024-03-28 MED ORDER — SPIRONOLACTONE 25 MG PO TABS
12.5000 mg | ORAL_TABLET | Freq: Every day | ORAL | 1 refills | Status: DC
  Filled 2024-03-28: qty 45, 90d supply, fill #0

## 2024-03-29 ENCOUNTER — Other Ambulatory Visit (HOSPITAL_COMMUNITY): Payer: Self-pay

## 2024-03-29 ENCOUNTER — Other Ambulatory Visit: Payer: Self-pay | Admitting: Cardiology
